# Patient Record
Sex: Female | Born: 1955 | Hispanic: No | Marital: Married
Health system: Midwestern US, Community
[De-identification: ages and names within clinical notes are randomized; demographics above are authoritative.]

## PROBLEM LIST (undated history)

## (undated) DIAGNOSIS — R1013 Epigastric pain: Principal | ICD-10-CM

## (undated) DIAGNOSIS — Z202 Contact with and (suspected) exposure to infections with a predominantly sexual mode of transmission: Principal | ICD-10-CM

## (undated) DIAGNOSIS — N952 Postmenopausal atrophic vaginitis: Principal | ICD-10-CM

## (undated) DIAGNOSIS — R079 Chest pain, unspecified: Secondary | ICD-10-CM

## (undated) DIAGNOSIS — R3129 Other microscopic hematuria: Secondary | ICD-10-CM

## (undated) DIAGNOSIS — I10 Essential (primary) hypertension: Secondary | ICD-10-CM

## (undated) HISTORY — PX: APPENDECTOMY: SHX54

---

## 2015-12-14 ENCOUNTER — Encounter (HOSPITAL_COMMUNITY): Payer: Self-pay | Admitting: Emergency Medicine

## 2015-12-14 ENCOUNTER — Emergency Department (HOSPITAL_COMMUNITY): Payer: Self-pay

## 2015-12-14 ENCOUNTER — Emergency Department (HOSPITAL_COMMUNITY)
Admission: EM | Admit: 2015-12-14 | Discharge: 2015-12-14 | Disposition: A | Discharge status: Home / Self Care | Payer: Self-pay | Attending: Emergency Medicine | Admitting: Emergency Medicine

## 2015-12-14 DIAGNOSIS — I10 Essential (primary) hypertension: Secondary | ICD-10-CM | POA: Insufficient documentation

## 2015-12-14 DIAGNOSIS — E041 Nontoxic single thyroid nodule: Secondary | ICD-10-CM | POA: Insufficient documentation

## 2015-12-14 DIAGNOSIS — R0789 Other chest pain: Secondary | ICD-10-CM | POA: Insufficient documentation

## 2015-12-14 DIAGNOSIS — M542 Cervicalgia: Secondary | ICD-10-CM | POA: Insufficient documentation

## 2015-12-14 HISTORY — DX: Essential (primary) hypertension: I10

## 2015-12-14 LAB — I-STAT TROPONIN, ED
TROPONIN I, POC: 0 ng/mL (ref 0.00–0.08)
Troponin i, poc: 0 ng/mL (ref 0.00–0.08)

## 2015-12-14 LAB — BASIC METABOLIC PANEL
Anion gap: 10 (ref 5–15)
BUN: 14 mg/dL (ref 6–20)
CALCIUM: 9.3 mg/dL (ref 8.9–10.3)
CHLORIDE: 106 mmol/L (ref 101–111)
CO2: 25 mmol/L (ref 22–32)
CREATININE: 0.58 mg/dL (ref 0.44–1.00)
GFR calc non Af Amer: >60 mL/min (ref >60)
Glucose, Bld: 99 mg/dL (ref 65–99)
Potassium: 3.7 mmol/L (ref 3.5–5.1)
SODIUM: 141 mmol/L (ref 135–145)

## 2015-12-14 LAB — CBC WITH DIFFERENTIAL/PLATELET
BASOS PCT: 0 %
Basophils Absolute: 0 10*3/uL (ref 0.0–0.1)
EOS ABS: 0.1 10*3/uL (ref 0.0–0.7)
Eosinophils Relative: 1 %
HCT: 39 % (ref 36.0–46.0)
HEMOGLOBIN: 13.1 g/dL (ref 12.0–15.0)
LYMPHS ABS: 3.8 10*3/uL (ref 0.7–4.0)
Lymphocytes Relative: 39 %
MCH: 30 pg (ref 26.0–34.0)
MCHC: 33.6 g/dL (ref 30.0–36.0)
MCV: 89.4 fL (ref 78.0–100.0)
MONO ABS: 0.7 10*3/uL (ref 0.1–1.0)
MONOS PCT: 8 %
Neutro Abs: 5.1 10*3/uL (ref 1.7–7.7)
Neutrophils Relative %: 52 %
Platelets: 234 10*3/uL (ref 150–400)
RBC: 4.36 MIL/uL (ref 3.87–5.11)
RDW: 12.1 % (ref 11.5–15.5)
WBC: 9.9 10*3/uL (ref 4.0–10.5)

## 2015-12-14 LAB — TSH: TSH: 1.275 u[IU]/mL (ref 0.350–4.500)

## 2015-12-14 MED ORDER — IOPAMIDOL (ISOVUE-300) INJECTION 61%
INTRAVENOUS | Status: AC
  Administered 2015-12-14: 75 mL
  Filled 2015-12-14: qty 75

## 2015-12-14 MED ORDER — KETOROLAC TROMETHAMINE 30 MG/ML IJ SOLN
30.0000 mg | Freq: Once | INTRAMUSCULAR | Status: AC
  Administered 2015-12-14: 30 mg via INTRAVENOUS
  Filled 2015-12-14: qty 1

## 2015-12-14 NOTE — ED Notes (Signed)
Pt. reports right neck pain for 2 weeks with generalized weakness/fatigue , denies fever or chills , respirations unlabored..Marland Kitchen

## 2015-12-14 NOTE — ED Provider Notes (Signed)
Pt visit shared. Patient here for neck pain and chronic chest pain. She is in no distress in the department. CT scan demonstrates dental caries, no evidence of acute abscess requiring antibiotics. She also has incidental thyroid nodule. Discussed with patient and family findings of nodule and dental caries, need for outpatient follow-up. Presentation is not consistent with ACS, PE, dissection.  Paula FossaElizabeth Nikoleta Dady, MD 12/14/15 [phone]

## 2015-12-14 NOTE — Discharge Instructions (Signed)
You were seen in the emergency department for neck pain. You also complaining of chest pain. Labs today have been normal. Her CT scan of her neck showed a thyroid nodule that will need to be followed up by her family doctor with more imaging.  She also has dental carries on her scan and will need to see a dentist.  We recommend close outpatient follow-up with a primary care physician. Your EKG was abnormal but we do not have an old for comparison. We recommend she follow-up with a primary care physician or cardiologist for this.  You may take ibuprofen 800 mg every 8 hours as needed for pain. You may alternate this with Tylenol 1000 mg every 6 hours as needed for pain. These medications are found over-the-counter.   Chest Wall Pain Chest wall pain is pain in or around the bones and muscles of your chest. Sometimes, an injury causes this pain. Sometimes, the cause may not be known. This pain may take several weeks or longer to get better. HOME CARE INSTRUCTIONS  Pay attention to any changes in your symptoms. Take these actions to help with your pain:   Rest as told by your health care provider.   Avoid activities that cause pain. These include any activities that use your chest muscles or your abdominal and side muscles to lift heavy items.   If directed, apply ice to the painful area:  Put ice in a plastic bag.  Place a towel between your skin and the bag.  Leave the ice on for 20 minutes, 2-3 times per day.  Take over-the-counter and prescription medicines only as told by your health care provider.  Do not use tobacco products, including cigarettes, chewing tobacco, and e-cigarettes. If you need help quitting, ask your health care provider.  Keep all follow-up visits as told by your health care provider. This is important. SEEK MEDICAL CARE IF:  You have a fever.  Your chest pain becomes worse.  You have new symptoms. SEEK IMMEDIATE MEDICAL CARE IF:  You have nausea or  vomiting.  You feel sweaty or light-headed.  You have a cough with phlegm (sputum) or you cough up blood.  You develop shortness of breath.   This information is not intended to replace advice given to you by your health care provider. Make sure you discuss any questions you have with your health care provider.   Document Released: 08/16/2005 Document Revised: 05/07/2015 Document Reviewed: 11/11/2014 Elsevier Interactive Patient Education [employer]   To find a primary care or specialty doctor please call [phone] or [phone] to access "[place] Find a Doctor Service."  You may also go on the [facility] website at [email or web address]  There are also multiple [facility] and [facility] practices throughout the [place] that are frequently accepting new patients. You may find a clinic that is close to your home and contact them.  [facility] -  [address] [place] [address] [phone]  [facility] in [place] (also locations in [place] and [place]) -  [address] [place] [address] [phone]  [facility] Department -  [address] [place] [address] [phone]   Thyroid Nodule A thyroid nodule is an isolatedgrowth of thyroid cells that forms a lump in your thyroid gland. The thyroid gland is a butterfly-shaped gland. It is found in the lower front of your neck. This gland sends chemical messengers (hormones) through your blood to all  parts of your body. These hormones are important in regulating your body temperature and helping your body to use energy. Thyroid nodules are common. Most are not cancerous (are benign). You may have one nodule or several nodules.  Different types of thyroid nodules include:  Nodules that grow and fill with fluid (thyroid cysts).  Nodules that produce too much thyroid hormone (hot nodules or  hyperthyroid).  Nodules that produce no thyroid hormone (cold nodules or hypothyroid).  Nodules that form from cancer cells (thyroid cancers). CAUSES Usually, the cause of this condition is not known. RISK FACTORS Factors that make this condition more likely to develop include:  Increasing age. Thyroid nodules become more common in people who are older than 58 years of age.  Gender.  Benign thyroid nodules are more common in women.  Cancerous (malignant) thyroid nodules are more common in men.  A family history that includes:  Thyroid nodules.  Pheochromocytoma.  Thyroid carcinoma.  Hyperparathyroidism.  Certain kinds of thyroid diseases, such as Hashimoto thyroiditis.  Lack of iodine.  A history of head and neck radiation, such as from X-rays. SYMPTOMS It is common for this condition to cause no symptoms. If you have symptoms, they may include:  A lump in your lower neck.  Feeling a lump or tickle in your throat.  Pain in your neck, jaw, or ear.  Having trouble swallowing. Hot nodules may cause symptoms that include:  Weight loss.  Warm, flushed skin.  Feeling hot.  Feeling nervous.  A racing heartbeat. Cold nodules may cause symptoms that include:  Weight gain.  Dry skin.  Brittle hair. This may also occur with hair loss.  Feeling cold.  Fatigue. Thyroid cancer nodules may cause symptoms that include:  Hard nodules that feel stuck to the thyroid gland.  Hoarseness.  Lumps in the glands near your thyroid (lymph nodes). DIAGNOSIS A thyroid nodule may be felt by your health care provider during a physical exam. This condition may also be diagnosed based on your symptoms. You may also have tests, including:  An ultrasound. This may be done to confirm the diagnosis.  A biopsy. This involves taking a sample from the nodule and looking at it under a microscope to see if the nodule is benign.  Blood tests to make sure that your thyroid is  working properly.  Imaging tests such as MRI or CT scan may be done if:  Your nodule is large.  Your nodule is blocking your airway.  Cancer is suspected. TREATMENT Treatment depends on the cause and size of your nodule or nodules. If the nodule is benign, treatment may not be necessary. Your health care provider may monitor the nodule to see if it goes away without treatment. If the nodule continues to grow, is cancerous, or does not go away:  It may need to be drained with a needle.  It may need to be removed with surgery. If you have surgery, part or all of your thyroid gland may need to be removed as well. HOME CARE INSTRUCTIONS  Pay attention to any changes in your nodule.  Take over-the-counter and prescription medicines only as told by your health care provider.  Keep all follow-up visits as told by your health care provider. This is important. SEEK MEDICAL CARE IF:  Your voice changes.  You have trouble swallowing.  You have pain in your neck, ear, or jaw that is getting worse.  Your nodule gets bigger.  Your nodule starts to make it harder for  you to breathe. SEEK IMMEDIATE MEDICAL CARE IF:  You have a sudden fever.  You feel very weak.  Your muscles look like they are shrinking (muscle wasting).  You have mood swings.  You feel very restless.  You feel confused.  You are seeing or hearing things that other people do not see or hear (having hallucinations).  You feel suddenly nauseous or throw up.  You suddenly have diarrhea.  You have chest pain.  There is a loss of consciousness.   This information is not intended to replace advice given to you by your health care provider. Make sure you discuss any questions you have with your health care provider.   Document Released: 07/09/2004 Document Revised: 05/07/2015 Document Reviewed: 11/27/2014 Elsevier Interactive Patient Education [employer]

## 2015-12-14 NOTE — ED Provider Notes (Addendum)
TIME SEEN: 5:00 AM  CHIEF COMPLAINT: Neck pain, chest pain  HPI: Pt is a 60 y.o. female with history of hypertension who recently moved here from [place] 2 weeks ago who presents to the emergency department with complaints of right anterior neck pain for the past 2 weeks. No history of injury to the neck. No swelling to the neck. No fevers currently, cough. She has had left-sided throbbing chest pain for several years that is unchanged. No shortness of breath. No vomiting or diarrhea. No numbness, tingling or focal weakness. No bowel or bladder incontinence. Her son at bedside states that she was told she had something along with her thyroid and they recommended outpatient ultrasound. States he brought her to the hospital today because she could not sleep because of her pain. Patient reports it is painful to move her neck but also painful to swallow. She has no swelling currently but they state that she has had intermittent swelling to this area. They're unsure if she's been told that she has a mass or an infection. They do state that she had a fever 2 weeks ago.  History is limited as patient speaks Arabic. Interpreter used the patient and son are both very poor historians.  ROS: See HPI Constitutional: no fever  Eyes: no drainage  ENT: no runny nose   Cardiovascular:  chest pain  Resp: no SOB  GI: no vomiting GU: no dysuria Integumentary: no rash  Allergy: no hives  Musculoskeletal: no leg swelling  Neurological: no slurred speech ROS otherwise negative  PAST MEDICAL HISTORY/PAST SURGICAL HISTORY:  Past Medical History  Diagnosis Date  . Hypertension     MEDICATIONS:  Prior to Admission medications   Not on File    ALLERGIES:  No Known Allergies  SOCIAL HISTORY:  Social History  Substance Use Topics  . Smoking status: Never Smoker   . Smokeless tobacco: Not on file  . Alcohol Use: No    FAMILY HISTORY: No family history on file.  EXAM: BP 154/87 mmHg  Pulse 61   Temp(Src) 98.5 F (36.9 C) (Oral)  Resp 18  Wt 135 lb 7 oz (61.434 kg)  SpO2 100% CONSTITUTIONAL: Alert and oriented and responds appropriately to questions. Well-appearing; well-nourished, No distress HEAD: Normocephalic EYES: Conjunctivae clear, PERRL ENT: normal nose; no rhinorrhea; moist mucous membranes; No pharyngeal erythema or petechiae, no tonsillar hypertrophy or exudate, no uvular deviation, no trismus or drooling, normal phonation, no stridor, no dental caries or abscess noted, no Ludwig's angina, tongue sits flat in the bottom of the mouth NECK: Supple, no meningismus, no LAD; tender to palpation over the right anterior neck underneath jaw, no thyromegaly appreciated, no midline spinal tenderness or step-off or deformity CARD: RRR; S1 and S2 appreciated; no murmurs, no clicks, no rubs, no gallops CHEST:  Tender to palpation over the left chest wall without crepitus, ecchymosis or deformity. Palpation to this area reproduces her chest pain. RESP: Normal chest excursion without splinting or tachypnea; breath sounds clear and equal bilaterally; no wheezes, no rhonchi, no rales, no hypoxia or respiratory distress, speaking full sentences ABD/GI: Normal bowel sounds; non-distended; soft, non-tender, no rebound, no guarding, no peritoneal signs BACK:  The back appears normal and is non-tender to palpation, there is no CVA tenderness EXT: Normal ROM in all joints; non-tender to palpation; no edema; normal capillary refill; no cyanosis, no calf tenderness or swelling    SKIN: Normal color for age and race; warm; no rash NEURO: Moves all extremities equally, sensation  to light touch intact diffusely, cranial nerves II through XII intact, strength 5/5 in all 4 days, 2+ deep tendon reflexes in bilateral upper and lower extremities, normal gait PSYCH: The patient's mood and manner are appropriate. Grooming and personal hygiene are appropriate.  MEDICAL DECISION MAKING: Patient here with  complaints of right neck pain. Appears to be anterior and I don't appreciate any swelling she does report pain with palpation, movement and swallowing. There is questionable history of fever several weeks ago and possible thyroid mass. Will obtain a CT of her neck for further evaluation to evaluate for possible infection versus cancerous lesions. She has no sign of Ludwig's angina and swallowing without difficulty. Normal phonation.  Also complaining of chest pain on review of systems. Reports this is been present for several years. No shortness of breath currently, nausea, diaphoresis or dizziness. She has hypertension. No other risk factors for CAD. No history of stress test or cardiac catheterization. EKG shows intraventricular conduction delay with no old for comparison. Her pain is reproducible with palpation of the left side of her chest. First troponin is negative. Given her abnormal EKG, will repeat a second troponin. This seems very atypical and at this time I do not feel she needs admission but can be followed as an outpatient. They're comfortable with this plan.  ED PROGRESS: Patient has no leukocytosis. Labs otherwise unremarkable. Chest x-ray shows no infiltrate. There is mild cardiomegaly but no edema. No pneumothorax.  7:00 AM  Due to patient's abnormal EKG, will obtain a second troponin. Her chest pain seems very atypical in nature and is likely musculoskeletal. It has been present for years and is not associated shortness of breath, diaphoresis, nausea, vomiting or dizziness. It does not appear to be exertional. Suspect that her EKG changes are old but we have no old for comparison. I have recommended outpatient follow-up with a cardiologist will provide PCP information. Second troponin will be repeated at 8:30 AM. She is still awaiting a CT scan of her neck. If this is normal, I recommend close outpatient follow-up. Signed out to Dr. Madilyn Hookees who will follow-up on patient's CT imaging as well as  her second troponin. Family comfortable with this plan. TSH is also pending but I instructed family that this likely will not return today and that their primary care physician can follow up on this test.     I reviewed all nursing notes, vitals, pertinent old records, EKGs, labs, imaging (as available).      EKG Interpretation  Date/Time:  Sunday December 14 2015 05:53:14 EDT Ventricular Rate:  70 PR Interval:  180 QRS Duration: 138 QT Interval:  417 QTC Calculation: 450 R Axis:   61 Text Interpretation:  Sinus rhythm IVCD, consider atypical LBBB Baseline wander in lead(s) V3 V4 V5 No old tracing to compare Confirmed by WARD,  DO, KRISTEN [phone]([id number]) on 12/14/2015 5:56:47 AM        Layla MawKristen N Ward, DO 12/14/15 0701  Layla MawKristen N Ward, DO 12/14/15 [id number]

## 2015-12-14 NOTE — ED Notes (Signed)
Patient speaks Arabic.

## 2015-12-26 DIAGNOSIS — I10 Essential (primary) hypertension: Secondary | ICD-10-CM | POA: Insufficient documentation

## 2015-12-26 DIAGNOSIS — Z791 Long term (current) use of non-steroidal anti-inflammatories (NSAID): Secondary | ICD-10-CM | POA: Insufficient documentation

## 2015-12-26 DIAGNOSIS — Z79899 Other long term (current) drug therapy: Secondary | ICD-10-CM | POA: Insufficient documentation

## 2015-12-26 DIAGNOSIS — Z7982 Long term (current) use of aspirin: Secondary | ICD-10-CM | POA: Insufficient documentation

## 2015-12-26 DIAGNOSIS — M545 Low back pain: Secondary | ICD-10-CM | POA: Insufficient documentation

## 2015-12-27 ENCOUNTER — Encounter (HOSPITAL_COMMUNITY): Payer: Self-pay | Admitting: Emergency Medicine

## 2015-12-27 ENCOUNTER — Emergency Department (HOSPITAL_COMMUNITY)
Admission: EM | Admit: 2015-12-27 | Discharge: 2015-12-27 | Disposition: A | Discharge status: Home / Self Care | Payer: Self-pay | Attending: Emergency Medicine | Admitting: Emergency Medicine

## 2015-12-27 ENCOUNTER — Emergency Department (HOSPITAL_COMMUNITY): Payer: Self-pay

## 2015-12-27 DIAGNOSIS — M545 Low back pain, unspecified: Secondary | ICD-10-CM

## 2015-12-27 LAB — URINALYSIS, ROUTINE W REFLEX MICROSCOPIC
Bilirubin Urine: NEGATIVE
Glucose, UA: NEGATIVE mg/dL
Ketones, ur: NEGATIVE mg/dL
Nitrite: NEGATIVE
Protein, ur: NEGATIVE mg/dL
Specific Gravity, Urine: 1.013 (ref 1.005–1.030)
pH: 5.5 (ref 5.0–8.0)

## 2015-12-27 LAB — URINE MICROSCOPIC-ADD ON

## 2015-12-27 MED ORDER — OXYCODONE-ACETAMINOPHEN 5-325 MG PO TABS: 1.0000 | ORAL_TABLET | ORAL | Status: DC | PRN

## 2015-12-27 MED ORDER — OXYCODONE-ACETAMINOPHEN 5-325 MG PO TABS
1.0000 | ORAL_TABLET | Freq: Once | ORAL | Status: AC
  Administered 2015-12-27: 1 via ORAL
  Filled 2015-12-27: qty 1

## 2015-12-27 NOTE — ED Notes (Signed)
Pt speaks Arabic, triage interview conducted using interpretor line. Pt c/o low back pain radiating down bilat legs with reports of numbness to bilat lower extremities.

## 2015-12-27 NOTE — Discharge Instructions (Signed)

## 2015-12-27 NOTE — ED Provider Notes (Signed)
CSN: [id number]     Arrival date & time 12/26/15  2350 History  By signing my name below, I, Linus Galas, attest that this documentation has been prepared under the direction and in the presence of Zadie Rhine, MD. Electronically Signed: Linus Galas, ED Scribe. 12/27/2015. 4:06 AM.   Chief Complaint  Patient presents with  . Back Pain   The history is provided by a relative. No language interpreter was used.   HPI Comments: Paula Espinoza is a 60 y.o. female brought by son to the Emergency Department with a PMHx of HTN complaining of ongoing lower back pain that worsened today. Son states her back pain radiates to her legs. Pt also reports mild bilateral leg weakness. Pt also reports mild bilateral leg weakness.  Pt denies any fever, CP, vomiting, abodominal pain, dysuria, bowel or bladder incontinence, or any other symptoms at this time.   Pt is visiting from [place].  Unable to contact interpreter via phone on multiple attempts. Son provided all the history.   Past Medical History  Diagnosis Date  . Hypertension    Past Surgical History  Procedure Laterality Date  . Appendectomy     No family history on file. Social History  Substance Use Topics  . Smoking status: Never Smoker   . Smokeless tobacco: None  . Alcohol Use: No   OB History    No data available     Review of Systems  Constitutional: Negative for fever.  Cardiovascular: Negative for chest pain.  Gastrointestinal: Negative for vomiting and abdominal pain.  Genitourinary: Negative for dysuria.  Musculoskeletal: Positive for back pain.  Neurological: Positive for weakness.  All other systems reviewed and are negative.  Allergies  Review of patient's allergies indicates no known allergies.  Home Medications   Prior to Admission medications   Medication Sig Start Date End Date Taking? Authorizing Provider  ASPIRIN PO Take 100 mg by mouth daily. ASPEGIC 100 MG(FOREIGN MANUFACTURER MEDICATION)   Yes  Historical Provider, MD  CARVEDILOL PO Take 6.25 mg by mouth 2 (two) times daily. XEDILOL (FOREIGN MANUFACTURER MEDICATION)   Yes Historical Provider, MD  ibuprofen (ADVIL,MOTRIN) 200 MG tablet Take 200 mg by mouth every 6 (six) hours as needed (for pain.).   Yes Historical Provider, MD   BP 148/83 mmHg  Pulse 58  Temp(Src) 97.9 F (36.6 C) (Oral)  Resp 18  Wt 134 lb 7.7 oz (61 kg)  SpO2 94%   Physical Exam   CONSTITUTIONAL: Well developed/well nourished HEAD: Normocephalic/atraumatic EYES: EOMI/PERRL ENMT: Mucous membranes moist NECK: supple no meningeal signs SPINE/BACK:lumbar tenderness noted CV: S1/S2 noted, no murmurs/rubs/gallops noted LUNGS: Lungs are clear to auscultation bilaterally, no apparent distress ABDOMEN: soft, nontender, no rebound or guarding GU:no cva tenderness NEURO: Awake/alert, equal motor 5/5 strength noted with the following: hip flexion/knee flexion/extension, foot dorsi/plantar flexion, great toe extension intact bilaterally, no clonus bilaterally, plantar reflex appropriate (toes downgoing), no sensory deficit in any dermatome.  Equal patellar/achilles reflex noted (2+) in bilateral lower extremities.  Pt is able to ambulate unassisted. EXTREMITIES: pulses normal, full ROM SKIN: warm, color normal PSYCH: no abnormalities of mood noted, alert and oriented to situation   ED Course  Procedures  DIAGNOSTIC STUDIES: Oxygen Saturation is 94% on room air, normal by my interpretation.    COORDINATION OF CARE: 3:59 AM Will give pain medication. Will order lumbar spine x-ray and urinalysis. Discussed treatment plan with pt at bedside and pt agreed to plan.  No neuro deficits Pt ambulates without  difficulty Xray negative Pt well appearing She is visiting from [place] for several months Outpatient referral given Short course of pain meds ordered Discussed strict return precautions with son Stable for d/c  Labs Review Labs Reviewed  URINALYSIS,  ROUTINE W REFLEX MICROSCOPIC (NOT AT [facility]) - Abnormal; Notable for the following:    APPearance CLOUDY (*)    Hgb urine dipstick MODERATE (*)    Leukocytes, UA TRACE (*)    All other components within normal limits  URINE MICROSCOPIC-ADD ON - Abnormal; Notable for the following:    Squamous Epithelial / LPF 6-30 (*)    Bacteria, UA RARE (*)    All other components within normal limits  URINE CULTURE    Imaging Review Dg Lumbar Spine Complete  12/27/2015  CLINICAL DATA:  Lower back pain for 1 week that worsened today. Pain radiates to legs. Initial encounter. EXAM: LUMBAR SPINE - COMPLETE 4+ VIEW COMPARISON:  None. FINDINGS: Numbering is based on the lowest ribs. Rib visualization is difficult on chest x-ray 12/14/2015 at the level of the lower thoracic spine. The vertebra labeled L5 has articulation between the right transverse process and sacrum. This can be a source of chronic pain, but would not explain history of acute pain. There is no fracture, endplate erosion, or evidence of focal bone lesion. No notable disc narrowing or endplate spurring. IMPRESSION: No acute finding or notable degenerative change. Electronically Signed   By: Marnee SpringJonathon  Watts M.D.   On: 12/27/2015 04:35   I have personally reviewed and evaluated these images and lab results as part of my medical decision-making.    MDM   Final diagnoses:  Acute low back pain    Nursing notes including past medical history and social history reviewed and considered in documentation xrays/imaging reviewed by myself and considered during evaluation Labs/vital reviewed myself and considered during evaluation    I personally performed the services described in this documentation, which was scribed in my presence. The recorded information has been reviewed and is accurate.       Zadie Rhineonald Taneal Sonntag, MD 12/27/15 [phone]

## 2015-12-27 NOTE — ED Notes (Signed)
Patient transported to X-ray 

## 2015-12-28 LAB — URINE CULTURE

## 2015-12-29 ENCOUNTER — Telehealth (HOSPITAL_BASED_OUTPATIENT_CLINIC_OR_DEPARTMENT_OTHER): Payer: Self-pay | Admitting: Emergency Medicine

## 2015-12-29 NOTE — Progress Notes (Signed)
ED Antimicrobial Stewardship Positive Culture Follow Up   Paula CreekRabia Espinoza is an 60 y.o. female who [facility] with a chief complaint of  Chief Complaint  Patient presents with  . Back Pain    Recent Results (from the past 720 hour(s))  Urine culture     Status: Abnormal   Collection Time: 12/27/15  5:06 AM  Result Value Ref Range Status   Specimen Description URINE, CLEAN CATCH  Final   Special Requests NONE  Final   Culture (A)  Final    >=100,000 COLONIES/mL GROUP B STREP(S.AGALACTIAE)ISOLATED TESTING AGAINST S. AGALACTIAE NOT ROUTINELY PERFORMED DUE TO PREDICTABILITY OF AMP/PEN/VAN SUSCEPTIBILITY. [facility]formed [facility]    Report Status 12/28/2015 FINAL  Final    [x]  Patient discharged originally without antimicrobial agent and treatment is now indicated  UA looked to be contaminated. Asymptomatic bacteruria   Plan: No treatment necessary  ED Provider: Alveta HeimlichStevi Barrett PA-C   Armandina StammerBATCHELDER,Allie Gerhold J 12/29/2015, 8:56 AM Infectious Diseases Pharmacist Phone# [phone]

## 2015-12-29 NOTE — Telephone Encounter (Signed)
Post ED Visit - Positive Culture Follow-up  Culture report reviewed by antimicrobial stewardship pharmacist:  [x]  Enzo BiNathan Batchelder, Pharm.D. []  Celedonio MiyamotoJeremy Frens, Pharm.D., BCPS []  Garvin FilaMike Maccia, Pharm.D. []  Georgina PillionElizabeth Martin, Pharm.D., BCPS []  [place]h Pham, [place]Pharm.D., BCPS, AAHIVP []  Estella HuskMichelle Turner, Pharm.D., BCPS, AAHIVP []  Tennis Mustassie Stewart, Pharm.D. []  Rob BeavertownVincent, [address]Pharm[address]  Positive urine culture Treated with none, asymptomatic,  and no further patient follow-up is required at this time.  Berle MullMiller, Chrystina Naff 12/29/2015, 9:29 AM

## 2016-01-14 ENCOUNTER — Encounter: Payer: Self-pay | Admitting: Endocrinology

## 2016-01-14 ENCOUNTER — Ambulatory Visit (INDEPENDENT_AMBULATORY_CARE_PROVIDER_SITE_OTHER): Payer: PRIVATE HEALTH INSURANCE | Admitting: Endocrinology

## 2016-01-14 VITALS — BP 110/68 | HR 70 | Temp 98.5°F | Resp 12 | Ht 60.5 in | Wt 135.0 lb

## 2016-01-14 DIAGNOSIS — E041 Nontoxic single thyroid nodule: Secondary | ICD-10-CM | POA: Diagnosis not present

## 2016-01-14 NOTE — Progress Notes (Signed)
Patient ID: Paula CreekRabia Boule, female   DOB: 05/04/1956, 60 y.o.   MRN: [id number]           Reason for Appointment: Evaluation of thyroid nodule    History of Present Illness:   The patient's thyroid nodule was first discovered incidentally on a CT scan done of her neck in the emergency room when she was having neck pain in 4/17   The CT scan showed a heterogenous 1.7 cm right-sided nodule Also has few subcentimeter nodules also  Her neck pain is in the upper part of the right neck area and is still present although somewhat less. She has not been told to have a thyroid enlargement in the past   Lab Results  Component Value Date   TSH 1.275 12/14/2015      Medication List       This list is accurate as of: 01/14/16  9:43 PM.  Always use your most recent med list.               acetaminophen 325 MG tablet  Commonly known as:  TYLENOL  Take 650 mg by mouth as needed.     ASPIRIN PO  Take 100 mg by mouth daily. ASPEGIC 100 MG(FOREIGN MANUFACTURER MEDICATION)     CARVEDILOL PO  Take 6.25 mg by mouth 2 (two) times daily. XEDILOL (FOREIGN MANUFACTURER MEDICATION)        Allergies: No Known Allergies  Past Medical History  Diagnosis Date  . Hypertension     There is no history of radiation to the neck in childhood  Past Surgical History  Procedure Laterality Date  . Appendectomy      Family History  Problem Relation Age of Onset  . Thyroid disease Neg Hx     Social History:  reports that she has never smoked. She does not have any smokeless tobacco history on file. She reports that she does not drink alcohol or use illicit drugs.   Review of Systems:  No history of recent weight change  She thinks she is eating less recently  No unusual fatigue          Examination:   BP 110/68 mmHg  Pulse 70  Temp(Src) 98.5 F (36.9 C) (Oral)  Resp 12  Ht 5' 0.5" (1.537 m)  Wt 135 lb (61.236 kg)  BMI 25.92 kg/m2  SpO2 98%   General Appearance:  well-looking         Eyes: No abnormal prominence or swelling of the eyes         THYROID: Thyroid nodule is palpable on the right side and mostly on swallowing, about 1.5-2 cm.  Left-sided is not palpable.  There is no lymphadenopathy in the neck  She has mild tenderness over the carotid bulb in the right neck Heart sounds normal Lungs clear Reflexes at biceps are normal.  Extremities: No edema  Assessment/Plan:  Thyroid nodule: She appears to have an incidental 1.7 cm nodule on CT scan on the right side This will need to be evaluated further with ultrasound. Discussed with the patient with interpretation done through her son the implications of thought to a nodules in the thyroid as well as the need to evaluate with needle aspiration biopsy; discussed how this would be done if indicated on her ultrasound report  Her TSH has been normal  Neck pain: Radiology and clear, maybe mild carotidynia.  Can try Advil when necessary  [facility] Center[facility]UMAR,Hellen Shanley 01/14/2016

## 2016-01-21 ENCOUNTER — Ambulatory Visit
Admission: RE | Admit: 2016-01-21 | Discharge: 2016-01-21 | Disposition: A | Discharge status: Home / Self Care | Payer: PRIVATE HEALTH INSURANCE | Source: Ambulatory Visit | Attending: Endocrinology | Admitting: Endocrinology

## 2016-01-21 DIAGNOSIS — E041 Nontoxic single thyroid nodule: Secondary | ICD-10-CM

## 2016-01-21 NOTE — Progress Notes (Signed)
Quick Note:  Please let her son know that she has a 2 cm nodule on the right side that needs needle aspiration biopsy. If okay will order this ______

## 2016-01-23 ENCOUNTER — Telehealth: Payer: Self-pay | Admitting: Endocrinology

## 2016-01-23 NOTE — Telephone Encounter (Signed)
No answer on call back 

## 2016-01-23 NOTE — Telephone Encounter (Signed)
Pt son calling back to schedule

## 2016-01-23 NOTE — Telephone Encounter (Signed)
Patient son call for the results of mom lab work

## 2016-01-27 ENCOUNTER — Other Ambulatory Visit: Payer: Self-pay | Admitting: Endocrinology

## 2016-01-27 DIAGNOSIS — E041 Nontoxic single thyroid nodule: Secondary | ICD-10-CM

## 2016-01-27 NOTE — Telephone Encounter (Signed)
Ordered

## 2016-01-27 NOTE — Telephone Encounter (Signed)
Patients son said to please order the biopsy.

## 2016-01-27 NOTE — Telephone Encounter (Signed)
PT said she is calling about an U/S result

## 2016-01-27 NOTE — Telephone Encounter (Signed)
Noted  

## 2016-02-04 NOTE — Telephone Encounter (Signed)
I contacted the pt's son and [facility]27/2016. Pt's son advised [facility] will be in charge of contacting the pt to schedule the biopsy. Pt's son voiced understanding.

## 2016-02-04 NOTE — Telephone Encounter (Signed)
Patient son calling back no one has contacted him to schedule her appt, patient is having pain in her neck.

## 2016-02-11 ENCOUNTER — Other Ambulatory Visit (HOSPITAL_COMMUNITY)
Admission: RE | Admit: 2016-02-11 | Discharge: 2016-02-11 | Disposition: A | Discharge status: Home / Self Care | Payer: PRIVATE HEALTH INSURANCE | Source: Ambulatory Visit | Attending: Radiology | Admitting: Radiology

## 2016-02-11 ENCOUNTER — Ambulatory Visit
Admission: RE | Admit: 2016-02-11 | Discharge: 2016-02-11 | Disposition: A | Discharge status: Home / Self Care | Payer: PRIVATE HEALTH INSURANCE | Source: Ambulatory Visit | Attending: Endocrinology | Admitting: Endocrinology

## 2016-02-11 DIAGNOSIS — E041 Nontoxic single thyroid nodule: Secondary | ICD-10-CM

## 2016-02-13 NOTE — Progress Notes (Signed)
Quick Note:  Please let her son know that biopsy is benign, follow-up in one year ______

## 2021-09-15 NOTE — Telephone Encounter (Signed)
Appointment request project. Patient is inquiring about an appointment with     Patient sent an email requesting to be seen for Urine infection and pain. Please assist.

## 2021-09-23 DIAGNOSIS — R079 Chest pain, unspecified: Secondary | ICD-10-CM

## 2021-09-23 NOTE — ED Provider Notes (Signed)
[facility]  EMERGENCY DEPARTMENT ENCOUNTER      Pt Name: Isabella Ortiz  MRN: [id number]  Birthdate 04/25/1956  Date of evaluation: 09/23/2021  Provider: Retal Tonkinson, DO    CHIEF COMPLAINT       Chief Complaint   Patient presents with    Chest Pain     Pt complains of intermittent left sided chest pain, left head/neck pain, and left arm tightness/numbness starting around 3pm today. Pt states pain is mostly in chest.         HISTORY OF PRESENT ILLNESS   (Location/Symptom, Timing/Onset, Context/Setting, Quality, Duration, Modifying Factors, Severity)  Note limiting factors.       Patient reports that she has hda chest pain for 8 days but yesterday was the worst. She reports it is intermittent and lasting 15-30 minutes. Radiates to left arm, left jaw and has SOB with it. Then itgoes away. She has HTN and HLD    The history is provided by the patient and a relative. No language interpreter was used.     Nursing Notes were reviewed.    REVIEW OF SYSTEMS    (2-9 systems for level 4, 10 or more for level 5)     Review of Systems   Constitutional:  Negative for activity change and chills.   HENT:  Negative for congestion.    Respiratory:  Positive for shortness of breath. Negative for cough.    Cardiovascular:  Positive for chest pain.     Except as noted above the remainder of the review of systems was reviewed and negative.       PAST MEDICAL HISTORY   No past medical history on file.      SURGICAL HISTORY     No past surgical history on file.      CURRENT MEDICATIONS       Previous Medications    No medications on file       ALLERGIES     Patient has no known allergies.    FAMILY HISTORY     No family history on file.       SOCIAL HISTORY          SCREENINGS         Glasgow Coma Scale  Eye Opening: Spontaneous  Best Verbal Response: Oriented  Best Motor Response: Obeys commands  Glasgow Coma Scale Score: 15                     CIWA Assessment  BP: 135/79  Heart Rate: 70                 PHYSICAL EXAM    (up to 7 for level 4,  8 or more for level 5)     ED Triage Vitals   BP Temp Temp Source Heart Rate Resp SpO2 Height Weight   09/23/21 1918 09/23/21 1919 09/23/21 1919 09/23/21 1918 09/23/21 1918 09/23/21 1918 -- --   (!) 142/76 98.5 ??F (36.9 ??C) Oral 68 16 97 %         Physical Exam  Constitutional:       Appearance: Normal appearance.   HENT:      Head: Normocephalic.      Nose: Nose normal.      Mouth/Throat:      Mouth: Mucous membranes are moist.   Eyes:      Extraocular Movements: Extraocular movements intact.   Cardiovascular:      Rate and Rhythm: Normal rate and  regular rhythm.      Pulses: Normal pulses.   Pulmonary:      Effort: Pulmonary effort is normal.      Breath sounds: Normal breath sounds.   Abdominal:      General: Abdomen is flat.      Palpations: Abdomen is soft.   Musculoskeletal:         General: Normal range of motion.      Cervical back: Normal range of motion.   Skin:     General: Skin is warm.      Capillary Refill: Capillary refill takes less than 2 seconds.   Neurological:      General: No focal deficit present.      Mental Status: She is alert and oriented to person, place, and time.   Psychiatric:         Mood and Affect: Mood normal.       DIAGNOSTIC RESULTS       RADIOLOGY:   Non-plain film images such as CT, Ultrasound and MRI are read by the radiologist. Plain radiographic images are visualized and preliminarily interpreted by the emergency physician with the below findings:        Interpretation per the Radiologist below, if available at the time of this note:    No orders to display         ED BEDSIDE ULTRASOUND:   Performed by ED Physician - none    LABS:  Labs Reviewed   COMPREHENSIVE METABOLIC PANEL - Abnormal; Notable for the following components:       Result Value    Glucose 108 (*)     All other components within normal limits   CBC   TROPONIN   TROPONIN   TROPONIN       All other labs were within normal range or not returned as of this dictation.    EMERGENCY DEPARTMENT COURSE and DIFFERENTIAL  DIAGNOSIS/MDM:   Vitals:    Vitals:    09/23/21 1918 09/23/21 1919 09/24/21 0126 09/24/21 0530   BP: (!) 142/76  126/77 135/79   Pulse: 68  67 70   Resp: 16  16 18    Temp:  98.5 ??F (36.9 ??C) 97.2 ??F (36.2 ??C)    TempSrc:  Oral Oral    SpO2: 97%  97% 98%         Medical Decision Making  Amount and/or Complexity of Data Reviewed  Labs: ordered.  ECG/medicine tests: ordered.    Risk  OTC drugs.           REASSESSMENT     ED Course as of 09/24/21 0637   Thu Sep 24, 2021   0608 D/w Dr Sep 26, 2021- Admit to [place] [facility] hospital[facility]     [RR]   [phone] EKG Interpretation:  Rhythm:  Sinus  Rate:  59  Axis:  left  Conduction:  normal and left bundle branch block (complete)  ST-T Wave Morphology:  non-specific  Ectopy:  none  ECG interpreted by me, in the absence of a cardiologist  [RR]   0628 D/w Dr 0629- She spoke to Dr Suzanna Obey and she will accept there. If the next trop is negative possible cards to see here and stress. Dr Laveda Norman also called back following his review of EKG. If second trop if + call stemi since LBBB and no comparision. The first EKG from last night is lost per staff here and cannot be found   [RR]   0635 Rpt Trop is negative which is  reassuring. She will eb admitted roper DT, chance of cards seeing here for stress depending on how long the transport delay is   [RR]      ED Course User Index  [RR] Sandrea Hammond, DO           CONSULTS:  IP CONSULT TO CARDIOLOGY    PROCEDURES:  Unless otherwise noted below, none     Procedures      FINAL IMPRESSION      1. Chest pain, unspecified type          DISPOSITION/PLAN   DISPOSITION Decision To Transfer 09/24/2021 06:36:03 AM      PATIENT REFERRED TO:  No follow-up provider specified.    DISCHARGE MEDICATIONS:  New Prescriptions    No medications on file     Controlled Substances Monitoring:     No flowsheet data found.    (Please note that portions of this note were completed with a voice recognition program.  Efforts were made to edit the dictations but occasionally words are  mis-transcribed.)    Tiphani Mells, DO (electronically signed)  Attending Emergency Physician            Sandrea Hammond, DO  09/24/21 [phone]

## 2021-09-24 ENCOUNTER — Observation Stay
Admission: EM | Admit: 2021-09-24 | Discharge: 2021-09-25 | Disposition: A | Discharge status: Home / Self Care | Payer: PRIVATE HEALTH INSURANCE | Source: Other Acute Inpatient Hospital | Admitting: Nephrology

## 2021-09-24 ENCOUNTER — Inpatient Hospital Stay
Admit: 2021-09-24 | Discharge: 2021-09-24 | Disposition: A | Discharge status: Home / Self Care | Payer: PRIVATE HEALTH INSURANCE | Attending: Emergency Medicine

## 2021-09-24 ENCOUNTER — Inpatient Hospital Stay: Payer: PRIVATE HEALTH INSURANCE

## 2021-09-24 DIAGNOSIS — R079 Chest pain, unspecified: Secondary | ICD-10-CM

## 2021-09-24 LAB — CBC
Hematocrit: 38.7 % (ref 34.0–47.0)
Hematocrit: 38 % (ref 34.0–47.0)
Hemoglobin: 12.9 g/dL (ref 11.5–15.7)
Hemoglobin: 12.6 g/dL (ref 11.5–15.7)
MCH: 30.6 pg (ref 27.0–34.5)
MCH: 30 pg (ref 27.0–34.5)
MCHC: 33.3 g/dL (ref 32.0–36.0)
MCHC: 33.2 g/dL (ref 32.0–36.0)
MCV: 91.7 fL (ref 81.0–99.0)
MCV: 90.5 fL (ref 81.0–99.0)
MPV: 11.6 fL (ref 7.2–13.2)
MPV: 12.2 fL (ref 7.2–13.2)
NRBC Absolute: 0 10*3/uL (ref 0.000–0.012)
NRBC Absolute: 0 10*3/uL (ref 0.000–0.012)
NRBC Automated: 0 % (ref 0.0–0.2)
NRBC Automated: 0 % (ref 0.0–0.2)
Platelets: 174 10*3/uL (ref 140–440)
Platelets: 172 10*3/uL (ref 140–440)
RBC: 4.22 x10e6/mcL (ref 3.60–5.20)
RBC: 4.2 x10e6/mcL (ref 3.60–5.20)
RDW: 12.1 % (ref 11.0–16.0)
RDW: 12.1 % (ref 11.0–16.0)
WBC: 9.7 10*3/uL (ref 3.8–10.6)
WBC: 6.5 10*3/uL (ref 3.8–10.6)

## 2021-09-24 LAB — COMPREHENSIVE METABOLIC PANEL
ALT: 24 U/L (ref 0–35)
AST: 23 U/L (ref 0–35)
Albumin/Globulin Ratio: 1.37 (ref 1.00–2.70)
Albumin: 4.1 g/dL (ref 3.5–5.2)
Alk Phosphatase: 74 U/L (ref 35–117)
Anion Gap: 8 mmol/L (ref 2–17)
BUN: 13 mg/dL (ref 8–23)
CO2: 29 mmol/L (ref 22–29)
Calcium: 9.2 mg/dL (ref 8.8–10.2)
Chloride: 104 mmol/L (ref 98–107)
Creatinine: 0.7 mg/dL (ref 0.5–1.0)
Est, Glom Filt Rate: 97 mL/min/1.73m?? (ref >=90)
Globulin: 3 g/dL (ref 1.9–4.4)
Glucose: 108 mg/dL — ABNORMAL HIGH (ref 70–99)
OSMOLALITY CALCULATED: 282 mOsm/kg (ref 270–287)
Potassium: 3.9 mmol/L (ref 3.5–5.3)
Sodium: 141 mmol/L (ref 135–145)
Total Bilirubin: 0.2 mg/dL (ref 0.00–1.20)
Total Protein: 7.1 g/dL (ref 6.4–8.3)

## 2021-09-24 LAB — TROPONIN
Troponin T: <0.01 ng/mL (ref 0.000–0.010)
Troponin T: <0.01 ng/mL (ref 0.000–0.010)

## 2021-09-24 LAB — APTT: PTT: 30.1 seconds (ref 23.3–34.5)

## 2021-09-24 LAB — CARDIAC PROCEDURE: Body Surface Area: 1.52 m2

## 2021-09-24 MED ORDER — SUCRALFATE 1 G PO TABS
1 GM | Freq: Three times a day (TID) | ORAL | Status: AC
Start: 2021-09-24 — End: 2021-09-25
  Administered 2021-09-24 – 2021-09-25 (×4): 1 g via ORAL

## 2021-09-24 MED ORDER — ASPIRIN 325 MG PO TABS
325 MG | ORAL | Status: AC
Start: 2021-09-24 — End: 2021-09-24
  Administered 2021-09-24: 11:00:00

## 2021-09-24 MED ORDER — VERAPAMIL HCL 2.5 MG/ML IV SOLN
2.5 MG/ML | INTRAVENOUS | Status: DC | PRN
Start: 2021-09-24 — End: 2021-09-24
  Administered 2021-09-24: 17:00:00 3 via INTRA_ARTERIAL

## 2021-09-24 MED ORDER — VERAPAMIL HCL 2.5 MG/ML IV SOLN
2.5 MG/ML | INTRAVENOUS | Status: AC
Start: 2021-09-24 — End: ?

## 2021-09-24 MED ORDER — MIDAZOLAM HCL 5 MG/5ML IJ SOLN
55 MG/ML | INTRAMUSCULAR | Status: AC | PRN
Start: 2021-09-24 — End: 2021-09-24
  Administered 2021-09-24 (×2): 1 via INTRAVENOUS

## 2021-09-24 MED ORDER — ONDANSETRON 4 MG PO TBDP
4 MG | Freq: Three times a day (TID) | ORAL | Status: AC | PRN
Start: 2021-09-24 — End: 2021-09-25

## 2021-09-24 MED ORDER — NITROGLYCERIN 2 % TD OINT
2 % | Freq: Four times a day (QID) | TRANSDERMAL | Status: DC
Start: 2021-09-24 — End: 2021-09-24

## 2021-09-24 MED ORDER — ALUM & MAG HYDROXIDE-SIMETH 200-200-20 MG/5ML PO SUSP
200-200-205 MG/5ML | Freq: Four times a day (QID) | ORAL | Status: AC | PRN
Start: 2021-09-24 — End: 2021-09-25
  Administered 2021-09-25: 05:00:00 30 mL via ORAL

## 2021-09-24 MED ORDER — NITROGLYCERIN 2 % TD OINT
2 % | TRANSDERMAL | Status: DC | PRN
Start: 2021-09-24 — End: 2021-09-25

## 2021-09-24 MED ORDER — HEPARIN SODIUM (PORCINE) 1000 UNIT/ML IJ SOLN
1000 UNIT/ML | INTRAMUSCULAR | Status: AC
Start: 2021-09-24 — End: ?

## 2021-09-24 MED ORDER — ACETAMINOPHEN 325 MG PO TABS
325 | Freq: Four times a day (QID) | ORAL | Status: DC | PRN
Start: 2021-09-24 — End: 2021-09-25
  Administered 2021-09-25: 15:00:00 650 mg via ORAL

## 2021-09-24 MED ORDER — FENTANYL CITRATE (PF) 100 MCG/2ML IJ SOLN
100 MCG/2ML | INTRAMUSCULAR | Status: AC
Start: 2021-09-24 — End: ?

## 2021-09-24 MED ORDER — HEPARIN SODIUM (PORCINE) 1000 UNIT/ML IJ SOLN
1000 UNIT/ML | INTRAMUSCULAR | Status: DC | PRN
Start: 2021-09-24 — End: 2021-09-24

## 2021-09-24 MED ORDER — LIDOCAINE HCL 1 % IJ SOLN
1 % | INTRAMUSCULAR | Status: DC | PRN
Start: 2021-09-24 — End: 2021-09-24
  Administered 2021-09-24: 17:00:00 5 via INTRADERMAL

## 2021-09-24 MED ORDER — PANTOPRAZOLE SODIUM 40 MG IV SOLR
40 MG | Freq: Two times a day (BID) | INTRAVENOUS | Status: AC
Start: 2021-09-24 — End: 2021-09-25
  Administered 2021-09-24 – 2021-09-25 (×3): 40 mg via INTRAVENOUS

## 2021-09-24 MED ORDER — HEPARIN SODIUM (PORCINE) 1000 UNIT/ML IJ SOLN
1000 UNIT/ML | Freq: Once | INTRAMUSCULAR | Status: AC
Start: 2021-09-24 — End: 2021-09-24
  Administered 2021-09-24: 16:00:00 3120 [IU]/kg via INTRAVENOUS

## 2021-09-24 MED ORDER — ACETAMINOPHEN 650 MG RE SUPP
650 | Freq: Four times a day (QID) | RECTAL | Status: DC | PRN
Start: 2021-09-24 — End: 2021-09-25

## 2021-09-24 MED ORDER — FENTANYL CITRATE (PF) 100 MCG/2ML IJ SOLN
100 MCG/2ML | INTRAMUSCULAR | Status: AC | PRN
Start: 2021-09-24 — End: 2021-09-24
  Administered 2021-09-24 (×2): 25 via INTRAVENOUS

## 2021-09-24 MED ORDER — MIDAZOLAM HCL 5 MG/5ML IJ SOLN
55 MG/ML | INTRAMUSCULAR | Status: AC
Start: 2021-09-24 — End: ?

## 2021-09-24 MED ORDER — ENOXAPARIN SODIUM 40 MG/0.4ML IJ SOSY
40 MG/0.4ML | Freq: Every evening | INTRAMUSCULAR | Status: AC
Start: 2021-09-24 — End: 2021-09-25
  Administered 2021-09-25: 05:00:00 40 mg via SUBCUTANEOUS

## 2021-09-24 MED ORDER — NORMAL SALINE FLUSH 0.9 % IV SOLN
0.9 % | Freq: Two times a day (BID) | INTRAVENOUS | Status: AC
Start: 2021-09-24 — End: 2021-09-25
  Administered 2021-09-25 (×3): 10 mL via INTRAVENOUS

## 2021-09-24 MED ORDER — SODIUM CHLORIDE 0.9 % IV SOLN
0.9 % | INTRAVENOUS | Status: AC | PRN
Start: 2021-09-24 — End: 2021-09-25

## 2021-09-24 MED ORDER — ALUM & MAG HYDROXIDE-SIMETH 200-200-20 MG/5ML PO SUSP
200-200-20 MG/5ML | Freq: Four times a day (QID) | ORAL | Status: DC | PRN
Start: 2021-09-24 — End: 2021-09-24

## 2021-09-24 MED ORDER — IOPAMIDOL 76 % IV SOLN
76 % | INTRAVENOUS | Status: DC | PRN
Start: 2021-09-24 — End: 2021-09-24
  Administered 2021-09-24: 17:00:00 40 via INTRA_ARTERIAL

## 2021-09-24 MED ORDER — FAMOTIDINE (PF) 20 MG/2ML IV SOLN
20 MG/2ML | Freq: Once | INTRAVENOUS | Status: AC
Start: 2021-09-24 — End: 2021-09-24
  Administered 2021-09-24: 12:00:00 20 mg via INTRAVENOUS

## 2021-09-24 MED ORDER — ACETAMINOPHEN 325 MG PO TABS
325 MG | ORAL | Status: AC | PRN
Start: 2021-09-24 — End: ?

## 2021-09-24 MED ORDER — NORMAL SALINE FLUSH 0.9 % IV SOLN
0.9 % | INTRAVENOUS | Status: AC | PRN
Start: 2021-09-24 — End: 2021-09-25

## 2021-09-24 MED ORDER — SENNOSIDES 8.6 MG PO TABS
8.6 MG | Freq: Every day | ORAL | Status: AC | PRN
Start: 2021-09-24 — End: 2021-09-25

## 2021-09-24 MED ORDER — HEPARIN SOD (PORCINE) IN D5W 100 UNIT/ML IV SOLN
100 UNIT/ML | INTRAVENOUS | Status: DC
Start: 2021-09-24 — End: 2021-09-24
  Administered 2021-09-24: 17:00:00 12 [IU]/kg/h via INTRAVENOUS

## 2021-09-24 MED ORDER — SODIUM CHLORIDE 0.9 % IV SOLN
0.9 % | INTRAVENOUS | Status: AC
Start: 2021-09-24 — End: 2021-09-24

## 2021-09-24 MED ORDER — ASPIRIN 81 MG PO CHEW
81 MG | Freq: Once | ORAL | Status: AC
Start: 2021-09-24 — End: 2021-09-24
  Administered 2021-09-24: 11:00:00 324 mg via ORAL

## 2021-09-24 MED ORDER — POLYETHYLENE GLYCOL 3350 17 G PO PACK
17 g | Freq: Every day | ORAL | Status: AC | PRN
Start: 2021-09-24 — End: 2021-09-25

## 2021-09-24 MED ORDER — ONDANSETRON HCL 4 MG/2ML IJ SOLN
4 MG/2ML | Freq: Four times a day (QID) | INTRAMUSCULAR | Status: DC | PRN
Start: 2021-09-24 — End: 2021-09-25

## 2021-09-24 MED ORDER — LIDOCAINE HCL 1 % IJ SOLN
1 % | INTRAMUSCULAR | Status: AC
Start: 2021-09-24 — End: ?

## 2021-09-24 MED FILL — SUCRALFATE 1 G PO TABS: 1 GM | ORAL | Qty: 1

## 2021-09-24 MED FILL — HEPARIN SODIUM (PORCINE) 1000 UNIT/ML IJ SOLN: 1000 UNIT/ML | INTRAMUSCULAR | Qty: 10

## 2021-09-24 MED FILL — FENTANYL CITRATE (PF) 100 MCG/2ML IJ SOLN: 100 MCG/2ML | INTRAMUSCULAR | Qty: 2

## 2021-09-24 MED FILL — MIDAZOLAM HCL 5 MG/5ML IJ SOLN: 5 MG/ML | INTRAMUSCULAR | Qty: 5

## 2021-09-24 MED FILL — FAMOTIDINE (PF) 20 MG/2ML IV SOLN: 20 MG/2ML | INTRAVENOUS | Qty: 2

## 2021-09-24 MED FILL — VERAPAMIL HCL 2.5 MG/ML IV SOLN: 2.5 MG/ML | INTRAVENOUS | Qty: 2

## 2021-09-24 MED FILL — LIDOCAINE HCL 1 % IJ SOLN: 1 % | INTRAMUSCULAR | Qty: 20

## 2021-09-24 MED FILL — HEPARIN SOD (PORCINE) IN D5W 100 UNIT/ML IV SOLN: 100 UNIT/ML | INTRAVENOUS | Qty: 250

## 2021-09-24 MED FILL — ASPIRIN 325 MG PO TABS: 325 MG | ORAL | Qty: 1

## 2021-09-24 MED FILL — PANTOPRAZOLE SODIUM 40 MG IV SOLR: 40 MG | INTRAVENOUS | Qty: 40

## 2021-09-24 NOTE — Progress Notes (Signed)
Cardiology Progress Note      Subjective:  patient arrived to [facility] ED from [facility]. Appears comfortable but states having ongoing CP 5/10.       Physical Examination:  General: Awake, alert, no acute distress  Lungs: Clear to auscultation, non-labored respiration  Heart: Normal rate, regular rhythm, no murmur, rub or gallop    Abdomen: Soft, non-tender, non-distended, normal bowel sounds  Extremities: No cyanosis or clubbing. No edema     Telemetry: SR    Objective:  Vitals:    09/24/21 1040   BP: 135/80   Pulse: 62   Resp: 16   Temp: 97.7 ??F (36.5 ??C)   SpO2: 100%     No intake or output data in the 24 hours ending 09/24/21 1104     Current Inpatient Medications    Current Facility-Administered Medications:     nitroglycerin (NITRO-BID) 2 % ointment 0.5 inch, 0.5 inch, Topical, 4 times per day, Jordan Likes, PA-C    heparin (porcine) injection 3,120 Units, 60 Units/kg, IntraVENous, Once, Jordan Likes, PA-C    heparin (porcine) injection 3,120 Units, 60 Units/kg, IntraVENous, PRN, Jordan Likes, PA-C    heparin (porcine) injection 1,560 Units, 30 Units/kg, IntraVENous, PRN, Jordan Likes, PA-C    heparin 25,000 units in dextrose 5% 250 mL (premix) infusion, 5-30 Units/kg/hr, IntraVENous, Continuous, Jordan Likes, PA-C    Current Outpatient Medications:     valsartan (DIOVAN) 80 MG tablet, Take 100 mg by mouth daily, Disp: , Rfl:     amLODIPine (NORVASC) 5 MG tablet, Take 5 mg by mouth daily, Disp: , Rfl:     aspirin 325 MG tablet, Take 100 mg by mouth daily, Disp: , Rfl:     simvastatin (ZOCOR) 20 MG tablet, Take 20 mg by mouth nightly, Disp: , Rfl:     esomeprazole Magnesium (NEXIUM) 20 MG PACK, Take 20 mg by mouth daily, Disp: , Rfl:        Laboratory Data  Hematologic/Coags Chemistries   Recent Labs     09/23/21  1942   WBC 9.7   HGB 12.9   HCT 38.7   PLT 174     Lab Results   Component Value Date/Time    PROT 7.1 09/23/2021 07:42 PM    ALBUMIN 1.37 09/23/2021 07:42 PM     No components found for:  HGBA1C  No results found for: INR, PROTIME  No results found for: APTT  No results found for: DDIMER   Recent Labs     09/23/21  1942   NA 141   K 3.9   CL 104   CO2 29   BUN 13   CREATININE 0.7   ALBUMIN 1.37   BILITOT 0.20   ALKPHOS 74   AST 23   ALT 24     No results for input(s): GLU in the last 72 hours.  Recent Labs     09/23/21  1942 09/24/21  0551   TROPONINT <0.010 <0.010     No components found for: LIPID PANEL  Invalid input(s): LIPIDPANEL  No results found for: IRON, FERRITIN     Inflammatory/Respiratory Diabetes   No results found for: CRP  No results found for: ESR  ABGs:  No results found for: PHART, PO2ART, HCO3, PCO2ART   Lab Results   Component Value Date/Time    CREATININE 0.7 09/23/2021 07:42 PM                Diagnostic Images:  Xray Result (most recent):  No results found for this or any previous visit from the past 3650 days.      ASSESSMENT AND PLAN   1. Unstable angina: seen in [facility] ED with symptoms concerning for progressive now unsable angina with LBBB. Will start nitro paste and Heparin gtt. Will need cardiac cath. Risks and benefits of procedure were discussed with patient.

## 2021-09-24 NOTE — ED Notes (Signed)
Pt transported to cath lab at 11:50  Pt prepped for procedure  Pt had signed consent form with transport  PT A&O X4 on transport  Pts son with Pt and updated on situation.      Cordelia Pen, RN  09/24/21 [phone]

## 2021-09-24 NOTE — Consults (Signed)
Consultation Note             Date:  September 24, 2021  Patient name: Isabella Ortiz  Date of Birth: July 09, 1956    Reason for Consult:    Requesting Physician:    HISTORY OF PRESENT ILLNESS:   Isabella Ortiz is a 66 y.o. Other female who presents for evaluation for chest pain.  This is a 66 year old female who is from [place].  Unfortunately the history is somewhat complicated by language barrier.  The patient is accompanied by her son who serves as a Nurse, learning disability.    The patient describes episodic chest discomfort.  Apparently this has been going on for quite some time.  She describes the discomfort as a tightness in her chest that radiates up to her arm into her neck as well as into her jaw and even into the backside of her neck and back.  These episodes are sporadic and can last 10 to 15 minutes in duration.  They are associated with shortness of breath but no nausea emesis or diaphoresis.  She states that when she was in [place] she went to an emergency room there where they did blood work and apparently no further evaluation.  She is in town visiting with her family.  And these episodes have been getting worse for the past several months.  She states that they are occurring several times a day.  She complained yesterday to her son who brought her to the emergency room for evaluation.    In the emergency room an EKG revealed evidence of a left bundle branch block.  She had 2 sets of cardiac enzymes both of which were negative for myocardial necrosis.    Patient's cardiovascular risk factors include hypertension as well as hyperlipidemia.  Her family history is unknown.  There is no history of cigarette smoking.      PAST HISTORY    Past Medical History:    Hypertension.  Hyperlipidemia    Past Surgical History:   has no past surgical history on file.     Social History:   The patient is from [place] she is currently visiting her family.  No history of tobacco use.  Not use alcohol.    Family History: family history  is not on file.    REVIEW OF SYSTEMS:    Review of Systems - General ROS: negative for - chills, fever, or malaise  Psychological ROS: negative for - anxiety, behavioral disorder, or depression  ENT ROS: negative for - epistaxis, headaches, or visual changes  Endocrine ROS: negative for - malaise/lethargy, temperature intolerance, or unexpected weight changes  Respiratory ROS: negative for - cough, shortness of breath, or wheezing  Cardiovascular ROS: negative for - chest pain, dyspnea on exertion, edema, irregular heartbeat, loss of consciousness, palpitations, or paroxysmal nocturnal dyspnea  Gastrointestinal ROS: negative for - abdominal pain, appetite loss, blood in stools, change in bowel habits, change in stools, constipation, diarrhea, heartburn, or nausea/vomiting  Genito-Urinary ROS: negative for - dysuria or hematuria  Musculoskeletal ROS: negative for - joint pain, joint swelling, muscle pain, or muscular weakness  Neurological ROS: negative for - gait disturbance, numbness/tingling, visual changes, or weakness       PHYSICAL EXAMINATION      BP 135/79    Pulse 70    Temp 97.2 ??F (36.2 ??C) (Oral)    Resp 18    SpO2 98%    Gen:  Alert and oriented ??3 in no apparent distress  HEENT:  Normocephalic  and atraumatic; clear oropharynx  Cardiovascular: Regular rate and rhythm, normal S1/2, no murmurs, no JVD  Lungs: Clear to auscultation bilaterally  Abdomen: Nontender nondistended  Extremities: No clubbing cyanosis or edema  Neuro:  Ambulates with normal gait  Psych:  Appropriate mood and affect  Derm:  No new rashes or lesions    HOME MEDICATIONS     Prior to Admission medications    Not on File       Allergies:  Patient has no known allergies.    LABS AND DIAGNOSTICS      CBC:  Recent Labs     09/23/21  1942   WBC 9.7   RBC 4.22   HGB 12.9   HCT 38.7   MCV 91.7   RDW 12.1   PLT 174     CHEMISTRIES:  Recent Labs     09/23/21  1942   NA 141   K 3.9   CL 104   CO2 29   BUN 13   CREATININE 0.7   GLUCOSE 108*      LIVER PROFILE:  Recent Labs     09/23/21  1942   AST 23   ALT 24   BILITOT 0.20   ALKPHOS 74     No results for input(s): PROBNP in the last 72 hours.  Recent Labs     09/23/21  1942 09/24/21  0551   TROPONINT <0.010 <0.010         Xray Result (most recent)  No results found for this or any previous visit from the past 3650 days.      CT Result (most recent):  No results found for this or any previous visit from the past 3650 days.      ECHO:   No results found for this or any previous visit.      ECG: Sinus rhythm with a left bundle branch block    ASSESSMENT / RECOMMENDATIONS      Active Problems:  Chest pain: The patient is 66 year old female with risk factors for heart disease include hypertension hyperlipidemia.  She now presents with a longstanding history of chest discomfort but has gotten worse in a crescendo pattern.  She has a left bundle branch block noted on her electrocardiogram.  Given the crescendo nature of her chest discomfort as well as the symptoms it is quite concerning that this may represent progressive angina.  Given the left bundle branch block she certainly could be at risk of three-vessel disease making a [place] nuclear stress test somewhat limited in its utility to evaluate her situation.  Given that it seems best to proceed directly with cardiac catheterization for further evaluation of her coronary arteries.    I discussed this at length with her son who is at the bedside.  I explained to him the procedure in depth including the risks and indications for the procedure.    The patient is can be transferred to [facility] in order to undergo cardiac catheterization later this afternoon.    We very much appreciate the opportunity able to continue to assist in the care of this most interesting patient be happy to follow along with you.        Clarita Crane, MD    NOTE: This report was transcribed using voice recognition software. Every effort was made to ensure accuracy,  however, inadvertent computerized transcription errors may be present.

## 2021-09-24 NOTE — H&P (Signed)
Hospitalist History and Physical      CHIEF COMPLAINT:    Chief Complaint   Patient presents with    Chest Pain     Pt complains of chest pain x 8 days, pt is being held in ed for cath lab and admission.        Reason for Admission:  Chest Pain    History Obtained From:  Patient, Son    HISTORY OF PRESENT ILLNESS:      Patient is a 66 year old woman who is visiting her son from [place].  Patient understands Arabic though speaks a specific dialect that is a combination of [place] and Arabic.  I utilized a Financial tradertranslator via iPad and Arabic however patient was unable to adequately communicate with the translator.  We attempted to find a [place] and [place] [employer] sales representativespeaking translator they were unable to do so.  Patient and son are comfortable with patient's son translating for them.  Patient has had complaints of chest pain which has been present off and on for what sounds like about 2 years.  She has had previous endoscopic evaluation in last year with both colonoscopy and EGD which per patient was negative.  She does have some reflux type symptoms and notes some sour and burning taste in her mouth at times.  That seems to be a distinct problem other than this presenting chest pain.  Chest pain is located retrosternal i and with some radiation to the jaw as well as into the left arm.  Patient does not seem to have a positional component to the pain as well as does not have a inspiratory exacerbation of the pain.  Overall the symptoms have progressed and worsened over the last 8 to 10 days.  Patient has been treated with a urinary tract infection soon after arriving in the [place] from [place] at the end of December.  Patient is currently feeling well.  She is undergone a left heart cath with right radial access.  Discussed with cardiology that there were no significant obstructive lesions.  Hospital service is asked to see patient for admission for chest pain symptoms.  Patient is from [place] she plans to stay with her son  for the next 5 months    Past Medical History:     has no past medical history on file.  GERD    Past Surgical History:    No past surgical history on file.     Medications Prior to Admission:    Current Outpatient Medications   Medication Instructions    amLODIPine (NORVASC) 5 mg, Oral, DAILY    aspirin 100 mg, Oral, DAILY    esomeprazole Magnesium (NEXIUM) 20 mg, Oral, DAILY    simvastatin (ZOCOR) 20 mg, Oral, NIGHTLY    valsartan (DIOVAN) 100 mg, Oral, DAILY       Allergies:    Patient has no known allergies.    Social History:   Social History       Tobacco History       Smoking Status  Never Assessed      Smokeless Tobacco Use  Unknown              Alcohol History       Alcohol Use Status  Not Asked              Drug Use       Drug Use Status  Not Asked              Sexual  Activity       Sexually Active  Not Asked                Patient does not drink alcohol or smoke cigarettes.  She lives in [place] though is here to visit her son  She does not work outside the home.    Family History:   No family history on file.  No significant cardiopulmonary disease in the family    REVIEW OF SYSTEMS:  I have perfromed a 12 point review of systems on this patient and is negative except for positive portions mentioned in HPI above.     PHYSICAL EXAM:  Vitals:  BP (!) 141/79    Pulse 61    Temp 97.7 ??F (36.5 ??C) (Oral)    Resp 16    Ht 5\' 3"  (1.6 m)    Wt 114 lb 10.2 oz (52 kg)    SpO2 99%    BMI 20.31 kg/m??   Body mass index is 20.31 kg/m??.   General patient is a well-developed woman is resting comfortably in bed in no acute distress.  HEENT anicteric MMM  Pulmonary is clear bilaterally   cardiovascular is regular with what sounds to be a soft murmur  Abdomen is soft nontender light palpation.  No rebound or guarding.  Extremities no edema.    LABS:  Lab Results   Component Value Date    NA 141 09/23/2021    K 3.9 09/23/2021    CL 104 09/23/2021    CO2 29 09/23/2021    BUN 13 09/23/2021    CREATININE 0.7 09/23/2021     GLUCOSE 108 (H) 09/23/2021    CALCIUM 9.2 09/23/2021    PROT 7.1 09/23/2021    LABALBU 4.1 09/23/2021    BILITOT 0.20 09/23/2021    ALKPHOS 74 09/23/2021    AST 23 09/23/2021    ALT 24 09/23/2021    LABGLOM 97 09/23/2021    GLOB 3.0 09/23/2021       Lab Results   Component Value Date    WBC 6.5 09/24/2021    HGB 12.6 09/24/2021    HCT 38.0 09/24/2021    MCV 90.5 09/24/2021    PLT 172 09/24/2021       No results found for: CKTOTAL, CKMB, CKMBINDEX, TROPONINI    No results found for: VOL, APPEARANCE, COLORU, LABSPEC, LABPH, LEUKBLD, NITRU, GLUCOSEU, KETUA, UROBILINOGEN, KETUA, UROBILINOGEN, BILIRUBINUR, OCBU    IMAGING:  Cardiac procedure    Result Date: 09/24/2021    Normal coronary arteries   Normal LVEDP       ASSESSMENT AND PLAN:    Principal Problem:    Chest pain, unspecified    66 year old woman with a past medical history of hypertension and dyslipidemia presents with chest pain has been intermittent for the last 2 years.    Chest pain.  Etiology is uncertain.  Patient with concerning features underlying left bundle branch block and was taken to the cardiac Cath Lab where she underwent coronary angiography which was negative for any obstructive lesions.   Admit her to observation with unclear etiology of her chest pain symptoms.  There is what appears to be cardiac murmur versus a rub and will evaluate her pericardium with a 2D echo.  Pulmonary/pleural etiology is on the differential as well as a central vascular etiology.  If no diagnosis forthcoming and no resolution of symptoms will consider a CTA of the chest.  Place patient on empiric PPI as well as some  Carafate.  Advance diet as tolerated.    Continue to monitor symptoms.    Hypertension.  Plan to restart home medications.    Continue to monitor blood pressures during hospitalization    Dyslipidemia   Continue statin therapy.    Left Bundle branch block.  Possibly chronic        Patient to observation telemetry status.  Check labs in the morning.   Possible discharge to home with outpatient follow-up pending symptoms and results of pending tests    Son updated at length at bedside.  All his questions were answered best my ability      DVT prophylaxis: lovenox  Dispo: obs tele  Code status: Full Code   Medical Decision maker:Son YOussef  PCP: Pcp No     This note was created using voice recognition software and may contain typographic errors missed during final review. The intent is to have a complete and accurate medical record.  As a valued partner in this safety effort, if you have noted factual errors, please complete the Health Information Amendment/Correct Form or call the [facility] Health Information Management Office at [phone].    For any questions, please contact Team E

## 2021-09-25 ENCOUNTER — Observation Stay: Admit: 2021-09-25 | Payer: PRIVATE HEALTH INSURANCE

## 2021-09-25 LAB — TRANSTHORACIC ECHOCARDIOGRAM (TTE) COMPLETE (CONTRAST/BUBBLE/3D PRN)
AV Area by Peak Velocity: 1.6 cm2
AV Area by VTI: 1.8 cm2
AV Mean Gradient: 7 mmHg
AV Mean Velocity: 1.2 m/s
AV Peak Gradient: 12 mmHg
AV Peak Velocity: 1.8 m/s
AV VTI: 36.3 cm
AV Velocity Ratio: 0.5
AVA/BSA Peak Velocity: 1.1 cm2/m2
AVA/BSA VTI: 1.2 cm2/m2
Ao Root Index: 1.91 cm/m2
Aortic Root: 2.9 cm
Ascending Aorta Index: 2.11 cm/m2
Ascending Aorta: 3.2 cm
Body Surface Area: 1.52 m2
Est. RA Pressure: 3 mmHg
IVC Expiration: 1.3 cm
IVSd: 1.2 cm — AB (ref 0.6–0.9)
LA Area 4C: 16 cm2
LA Diameter: 2.6 cm
LA Major Axis: 6 cm
LA Size Index: 1.71 cm/m2
LA Volume 4C: 34 mL (ref 22–52)
LA Volume Index 4C: 22 mL/m2 (ref 16–34)
LA/AO Root Ratio: 0.9
LV EDV A4C: 72 mL
LV EDV Index A4C: 47 mL/m2
LV ESV A4C: 32 mL
LV ESV Index A4C: 21 mL/m2
LV Ejection Fraction A4C: 56 %
LV Mass 2D Index: 100.4 g/m2 — AB (ref 43–95)
LV Mass 2D: 152.6 g (ref 67–162)
LV RWT Ratio: 0.42
LVIDd Index: 2.83 cm/m2
LVIDd: 4.3 cm (ref 3.9–5.3)
LVOT Area: 3.1 cm2
LVOT Diameter: 2 cm
LVOT Mean Gradient: 2 mmHg
LVOT Peak Gradient: 3 mmHg
LVOT Peak Velocity: 0.9 m/s
LVOT SV: 65.6 ml
LVOT Stroke Volume Index: 43.2 mL/m2
LVOT VTI: 20.9 cm
LVOT:AV VTI Index: 0.58
LVPWd: 0.9 cm (ref 0.6–0.9)
Left Ventricular Ejection Fraction: 63
MR Peak Gradient: 21 mmHg
MR Peak Velocity: 2.3 m/s
MV A Velocity: 0.84 m/s
MV E Velocity: 0.72 m/s
MV E Wave Deceleration Time: 167 ms
MV E/A: 0.86
RV Free Wall Peak S': 15 cm/s
RVIDd: 4.7 cm
RVSP: 30 mmHg
TAPSE: 2.7 cm (ref 1.7–?)
TR Max Velocity: 2.61 m/s
TR Peak Gradient: 27 mmHg

## 2021-09-25 LAB — COMPREHENSIVE METABOLIC PANEL W/ REFLEX TO MG FOR LOW K
ALT: 20 U/L (ref 0–35)
AST: 22 U/L (ref 0–35)
Albumin/Globulin Ratio: 1.4 (ref 1.00–2.70)
Albumin: 3.8 g/dL (ref 3.5–5.2)
Alk Phosphatase: 66 U/L (ref 35–117)
Anion Gap: 7 mmol/L (ref 2–17)
BUN: 15 mg/dL (ref 8–23)
CALCIUM,CORRECTED,CCA: 9.3 mg/dL (ref 8.8–10.2)
CO2: 29 mmol/L (ref 22–29)
Calcium: 9.1 mg/dL (ref 8.8–10.2)
Chloride: 104 mmol/L (ref 98–107)
Creatinine: 0.6 mg/dL (ref 0.5–1.0)
Est, Glom Filt Rate: 101 mL/min/1.73m?? (ref >=90)
Globulin: 2.8 g/dL (ref 1.9–4.4)
Glucose: 98 mg/dL (ref 70–99)
OSMOLALITY CALCULATED: 280 mOsm/kg (ref 270–287)
Potassium: 3.7 mmol/L (ref 3.5–5.3)
Sodium: 140 mmol/L (ref 135–145)
Total Bilirubin: 0.38 mg/dL (ref 0.00–1.20)
Total Protein: 6.6 g/dL (ref 6.4–8.3)

## 2021-09-25 LAB — CBC WITH AUTO DIFFERENTIAL
Absolute Baso #: 0.1 10*3/uL (ref 0.0–0.2)
Absolute Eos #: 0.1 10*3/uL (ref 0.0–0.5)
Absolute Lymph #: 2.6 10*3/uL (ref 1.0–3.2)
Absolute Mono #: 0.8 10*3/uL (ref 0.3–1.0)
Basophils %: 0.9 % (ref 0.0–2.0)
Eosinophils %: 1 % (ref 0.0–7.0)
Hematocrit: 37.1 % (ref 34.0–47.0)
Hemoglobin: 12.7 g/dL (ref 11.5–15.7)
Immature Grans (Abs): 0.04 10*3/uL (ref 0.00–0.06)
Immature Granulocytes: 0.5 % (ref 0.0–0.6)
Lymphocytes: 33.8 % (ref 15.0–45.0)
MCH: 30.8 pg (ref 27.0–34.5)
MCHC: 34.2 g/dL (ref 32.0–36.0)
MCV: 89.8 fL (ref 81.0–99.0)
MPV: 12.7 fL (ref 7.2–13.2)
Monocytes: 9.8 % (ref 4.0–12.0)
NRBC Absolute: 0 10*3/uL (ref 0.000–0.012)
NRBC Automated: 0 % (ref 0.0–0.2)
Neutrophils %: 54 % (ref 42.0–74.0)
Neutrophils Absolute: 4.1 10*3/uL (ref 1.6–7.3)
Platelets: 156 10*3/uL (ref 140–440)
RBC: 4.13 x10e6/mcL (ref 3.60–5.20)
RDW: 12.1 % (ref 11.0–16.0)
WBC: 7.7 10*3/uL (ref 3.8–10.6)

## 2021-09-25 LAB — APTT
PTT: 41.5 seconds — ABNORMAL HIGH (ref 23.3–34.5)
PTT: 35.4 seconds — ABNORMAL HIGH (ref 23.3–34.5)

## 2021-09-25 MED ORDER — ALUM & MAG HYDROXIDE-SIMETH 200-200-20 MG/5ML PO SUSP
200-200-20 MG/5ML | Freq: Four times a day (QID) | ORAL | Status: DC | PRN
Start: 2021-09-25 — End: 2021-09-25
  Administered 2021-09-25: 15:00:00 30 mL via ORAL

## 2021-09-25 MED ORDER — IOPAMIDOL 76 % IV SOLN
76 % | Freq: Once | INTRAVENOUS | Status: AC | PRN
Start: 2021-09-25 — End: 2021-09-25
  Administered 2021-09-25: 16:00:00 100 mL via INTRAVENOUS

## 2021-09-25 MED ORDER — ALUM & MAG HYDROXIDE-SIMETH 200-200-20 MG/5ML PO SUSP
200-200-205 MG/5ML | Freq: Four times a day (QID) | ORAL | 0 refills | Status: AC | PRN
Start: 2021-09-25 — End: 2021-10-02

## 2021-09-25 MED ORDER — SUCRALFATE 1 G PO TABS
1 GM | ORAL_TABLET | Freq: Three times a day (TID) | ORAL | 0 refills | Status: AC
Start: 2021-09-25 — End: ?

## 2021-09-25 MED ORDER — ONDANSETRON 4 MG PO TBDP
4 MG | ORAL_TABLET | Freq: Three times a day (TID) | ORAL | 0 refills | Status: DC | PRN
Start: 2021-09-25 — End: 2021-10-02

## 2021-09-25 MED ORDER — ESOMEPRAZOLE MAGNESIUM 20 MG PO PACK
20 MG | Freq: Two times a day (BID) | ORAL | 0 refills | Status: AC
Start: 2021-09-25 — End: ?

## 2021-09-25 MED FILL — PANTOPRAZOLE SODIUM 40 MG IV SOLR: 40 MG | INTRAVENOUS | Qty: 40

## 2021-09-25 MED FILL — SUCRALFATE 1 G PO TABS: 1 GM | ORAL | Qty: 1

## 2021-09-25 MED FILL — TYLENOL 325 MG PO TABS: 325 MG | ORAL | Qty: 2

## 2021-09-25 MED FILL — BD POSIFLUSH 0.9 % IV SOLN: 0.9 % | INTRAVENOUS | Qty: 10

## 2021-09-25 MED FILL — ENOXAPARIN SODIUM 40 MG/0.4ML IJ SOSY: 40 MG/0.4ML | INTRAMUSCULAR | Qty: 0.4

## 2021-09-25 MED FILL — MAG-AL PLUS 200-200-20 MG/5ML PO LIQD: 200-200-20 MG/5ML | ORAL | Qty: 30

## 2021-09-25 NOTE — Discharge Summary (Signed)
Discharge Summary     Isabella Ortiz Identification:  Isabella Ortiz  DOB: [birth year]  MRN: [id number]   Account: [id number]     Admit date: 09/24/2021  Discharge date: 09/25/21  Attending provider: Janit Bern, DO        Primary care provider: Pcp No     Discharge Diagnoses:   Principal Problem:    Chest pain, unspecified  Active Problems:    Chest pain  Resolved Problems:    * No resolved hospital problems. [facility] Hospital Course:   Isabella Ortiz is a 66 y.o. female admitted to [facility]  09/24/2021 for chest pain  Taken from the HPI    HISTORY OF PRESENT ILLNESS:       Isabella Ortiz is a 66 year old woman who is visiting her son from [place].  Isabella Ortiz understands Arabic though speaks a specific dialect that is a combination of [place] and Arabic.  I utilized a Scientist, physiological and Arabic however Isabella Ortiz was unable to adequately communicate with the translator.  We attempted to find a [place] and [place] [employer] planner they were unable to do so.  Isabella Ortiz and son are comfortable with Isabella Ortiz's son translating for them.  Isabella Ortiz has had complaints of chest pain which has been present off and on for what sounds like about 2 years.  She has had previous endoscopic evaluation in last year with both colonoscopy and EGD which per Isabella Ortiz was negative.  She does have some reflux type symptoms and notes some sour and burning taste in her mouth at times.  That seems to be a distinct problem other than this presenting chest pain.  Chest pain is located retrosternal i and with some radiation to the jaw as well as into the left arm.  Isabella Ortiz does not seem to have a positional component to the pain as well as does not have a inspiratory exacerbation of the pain.  Overall the symptoms have progressed and worsened over the last 8 to 10 days.  Isabella Ortiz has been treated with a urinary tract infection soon after arriving in the [place] from [place] at the end of December.  Isabella Ortiz is currently feeling well.  She is undergone a  left heart cath with right radial access.  Discussed with cardiology that there were no significant obstructive lesions.  Hospital service is asked to see Isabella Ortiz for admission for chest pain symptoms.  Isabella Ortiz is from [place] she plans to stay with her son for the next 5 months     Hospital course.    Isabella Ortiz was transferred from [facility] and underwent left heart catheterization on 09/24/2021 by Dr. Anthony Sar which demonstrated no significant obstructive coronary disease and normal LVEDP.  Isabella Ortiz underwent right wrist access.  Overall did well with catheterization.  She continued to complain of some intermittent pain which was sometimes on her left side than on the right side.  She did have some occasional radiation to her left arm and into her back into her neck as well as into her jaw.  She had some epigastric pain and some generalized abdominal pains.  Efforts were made with translation in Arabic as well as trying to find a speaker in a [place] or [place].  Isabella Ortiz and her son were comfortable with him translating for her during the hospital encounter.  Isabella Ortiz was started on a GI cocktail which seemed to provide some intermittent relief.  She continued to have some chest symptoms.  She underwent an echocardiogram which  did not demonstrate any significant pericardial disease.  Given the persistence of her symptoms she underwent a CTA of the chest which did not demonstrate any pulmonary embolus or suggestive of dissection of the great vessels.  We will plan to continue her on a PPI as well as Carafate trial.  If symptoms would persist she may need to go under additional diagnostic evaluation including possibly an esophagram and EGD.  Overall Isabella Ortiz felt improved with her pain symptoms and extensive time counseling was spent with Isabella Ortiz and son reviewing hospital course.  Questions were answered to the best of my ability    Discharge Medications:     Medication List        START taking these  medications      aluminum & magnesium hydroxide-simethicone 200-200-20 MG/5ML Susp suspension  Commonly known as: MAALOX  Take 30 mLs by mouth every 6 hours as needed for Indigestion     ondansetron 4 MG disintegrating tablet  Commonly known as: ZOFRAN-ODT  Take 1 tablet by mouth every 8 hours as needed for Nausea or Vomiting     sucralfate 1 GM tablet  Commonly known as: CARAFATE  Take 1 tablet by mouth every 8 hours            CHANGE how you take these medications      esomeprazole Magnesium 20 MG Pack  Commonly known as: NEXIUM  Take 1 packet by mouth 2 times daily  What changed: when to take this            CONTINUE taking these medications      amLODIPine 5 MG tablet  Commonly known as: NORVASC     aspirin 325 MG tablet     simvastatin 20 MG tablet  Commonly known as: ZOCOR     valsartan 80 MG tablet  Commonly known as: DIOVAN               Where to Get Your Medications        These medications were sent to [place], [facility] [address] [phone]  [place] [address]      Phone: [phone]   aluminum & magnesium hydroxide-simethicone 200-200-20 MG/5ML Susp suspension  esomeprazole Magnesium 20 MG Pack  ondansetron 4 MG disintegrating tablet  sucralfate 1 GM tablet         Isabella Ortiz Instructions:    Discharge lab work: Per primary care recommend establishing  Activity: As tolerated  Diet: ADULT DIET; Regular    Code Status: Full Code    Follow-up visits:   [place].  [address]  [place] [address]  [phone]  Follow up  establish care with a primary care physician while here in the [place].  - call for appointment.    Fonnie Jarvis, MD  [address] [place] [address]  [phone]    Follow up  follow up as needed if your symptoms do not improve - may need upper endoscopy eval.       Procedures: Left heart cath 09/24/2021    Consults:    Cardiology    Examination:  Vitals:  Vitals:    09/25/21 0521 09/25/21 0800 09/25/21 1104 09/25/21 1432   BP: 137/78  137/78 (!) 145/76   Pulse: 60 58  68   Resp: '16 16  16   '$ Temp: 97.5 ??F (36.4 ??C) 97.3 ??  F (36.3 ??C)  97.5 ??F (36.4 ??C)   TempSrc: Oral Oral  Oral   SpO2: 97%   98%   Weight:   114 lb (51.7 kg)    Height:   '5\' 3"'$  (1.6 m)      Weight: Weight: 114 lb (51.7 kg)     24 hour intake/output:No intake or output data in the 24 hours ending 09/25/21 1603    General appearance well-appearing no acute distress  Pulmonary clear   Cards regular   abdomen is soft  Extremities without edema      Significant Diagnostics:   Radiology: CTA CHEST W WO CONTRAST    Result Date: 09/25/2021  CTA chest, CT abdomen pelvis: 09/25/21 INDICATION: Chest pain. R07.2,Precordial pain,ICD-10-CM epigastric pain COMPARISON: None TECHNIQUE: PE protocol (Postcontrast imaging from the thoracic inlet through the  hemidiaphragms in the pulmonary arterial phase. Axial 5x5 mm soft tissue and lung 2x2 mm images. Multiplanar 3-D volumetric MIP reconstructions through the pulmonary arteries per protocol.) CT scanning was performed using radiation dose  reduction techniques when appropriate, per system protocols. Routine protocol (Axial portal venous phase imaging from the lung bases to the pubic symphysis following IV contrast with coronal reconstruction.) CT scanning was performed using radiation dose reduction techniques when appropriate, per system protocols. CT scanning was performed using radiation dose reduction techniques when appropriate, per system protocols. FINDINGS: Vascular: No pulmonary embolism. Motion artifact in the lung bases limits evaluation. Lower Neck: Unremarkable. Airway: Patent. Lungs: No mass or consolidation. 2 mm nodule in the medial right lower lobe scarring versus atelectasis in the lingula and lower lobes. Pleura: No pneumothorax. No effusion. Heart: No pericardial effusion. Mediastinum/Nodes: No adenopathy. Bones:  No concerning osseous abnormality. Body Wall: Unremarkable. Motion artifact limits evaluation of the upper abdomen. Hepatobiliary: No hepatic mass. No biliary dilatation. Gallbladder: Unremarkable Pancreas: No mass or ductal dilation. Spleen: Unremarkable. GI: Mild rectosigmoid wall thickening. No obstruction. Peritoneum/Retroperitoneum: Unremarkable Adrenal: Unremarkable. Renal/Urinary: No mass or hydronephrosis. Left renal cysts. Reproductive: Unremarkable. Lymph Nodes: No adenopathy. Bones: No concerning osseous lesions. Body Wall: Unremarkable. Vascular: Portal vein is patent. May Thurner configuration.     No pulmonary embolus. Mild rectosigmoid colonic wall thickening, could reflect proctocolitis the appropriate clinical context. Recommend follow-up colonoscopy if not recently performed.    CT ABDOMEN PELVIS W IV CONTRAST Additional Contrast? None    Result Date: 09/25/2021  CTA chest, CT abdomen pelvis: 09/25/21 INDICATION: Chest pain. R07.2,Precordial pain,ICD-10-CM epigastric pain COMPARISON: None TECHNIQUE: PE protocol (Postcontrast imaging from the thoracic inlet through the  hemidiaphragms in the pulmonary arterial phase. Axial 5x5 mm soft tissue and lung 2x2 mm images. Multiplanar 3-D volumetric MIP reconstructions through the pulmonary arteries per protocol.) CT scanning was performed using radiation dose  reduction techniques when appropriate, per system protocols. Routine protocol (Axial portal venous phase imaging from the lung bases to the pubic symphysis following IV contrast with coronal reconstruction.) CT scanning was performed using radiation dose reduction techniques when appropriate, per system protocols. CT scanning was performed using radiation dose reduction techniques when appropriate, per system protocols. FINDINGS: Vascular: No pulmonary embolism. Motion artifact in the lung bases limits evaluation. Lower Neck: Unremarkable. Airway: Patent. Lungs: No mass or consolidation. 2 mm nodule  in the medial right lower lobe scarring versus atelectasis in the lingula and lower lobes. Pleura: No pneumothorax. No effusion. Heart: No pericardial effusion. Mediastinum/Nodes: No adenopathy. Bones: No concerning osseous abnormality. Body Wall: Unremarkable. Motion artifact limits evaluation of the upper abdomen. Hepatobiliary: No hepatic mass.  No biliary dilatation. Gallbladder: Unremarkable Pancreas: No mass or ductal dilation. Spleen: Unremarkable. GI: Mild rectosigmoid wall thickening. No obstruction. Peritoneum/Retroperitoneum: Unremarkable Adrenal: Unremarkable. Renal/Urinary: No mass or hydronephrosis. Left renal cysts. Reproductive: Unremarkable. Lymph Nodes: No adenopathy. Bones: No concerning osseous lesions. Body Wall: Unremarkable. Vascular: Portal vein is patent. May Thurner configuration.     No pulmonary embolus. Mild rectosigmoid colonic wall thickening, could reflect proctocolitis the appropriate clinical context. Recommend follow-up colonoscopy if not recently performed.    Transthoracic echocardiogram (TTE) complete with contrast, bubble, strain, and 3D PRN    Result Date: 09/25/2021    Left Ventricle: Normal left ventricular systolic function with a visually estimated EF of 60 - 65%. Left ventricle size is normal. Normal wall thickness. Diastolic dysfunction present with normal LV EF.   Aortic Valve: Trileaflet valve. Mild sclerosis of the aortic valve cusp.   Mitral Valve: Mildly thickened leaflet.   Left Atrium: Left atrium is mildly dilated.     Cardiac procedure    Result Date: 09/24/2021    Normal coronary arteries   Normal LVEDP       Labs:   Recent Results (from the past 72 hour(s))   CBC    Collection Time: 09/23/21  7:42 PM   Result Value Ref Range    WBC 9.7 3.8 - 10.6 x10e3/mcL    RBC 4.22 3.60 - 5.20 x10e6/mcL    Hemoglobin 12.9 11.5 - 15.7 g/dL    Hematocrit 38.7 34.0 - 47.0 %    MCV 91.7 81.0 - 99.0 fL    MCH 30.6 27.0 - 34.5 pg    MCHC 33.3 32.0 - 36.0 g/dL    RDW 12.1 11.0 - 16.0  %    Platelets 174 140 - 440 x10e3/mcL    MPV 11.6 7.2 - 13.2 fL    NRBC Automated 0.0 0.0 - 0.2 %    NRBC Absolute 0.000 0.000 - 0.012 x10e3/mcL   Comprehensive Metabolic Panel    Collection Time: 09/23/21  7:42 PM   Result Value Ref Range    Sodium 141 135 - 145 mmol/L    Potassium 3.9 3.5 - 5.3 mmol/L    Chloride 104 98 - 107 mmol/L    CO2 29 22 - 29 mmol/L    Glucose 108 (H) 70 - 99 mg/dL    BUN 13 8 - 23 mg/dL    Creatinine 0.7 0.5 - 1.0 mg/dL    Anion Gap 8 2 - 17 mmol/L    OSMOLALITY CALCULATED 282 270 - 287 mOsm/kg    Calcium 9.2 8.8 - 10.2 mg/dL    Total Protein 7.1 6.4 - 8.3 g/dL    Albumin 4.1 3.5 - 5.2 g/dL    Globulin 3.0 1.9 - 4.4 g/dL    Albumin/Globulin Ratio 1.37 1.00 - 2.70    Total Bilirubin 0.20 0.00 - 1.20 mg/dL    Alk Phosphatase 74 35 - 117 unit/L    AST 23 0 - 35 unit/L    ALT 24 0 - 35 unit/L    Est, Glom Filt Rate 97 >=90 mL/min/1.49m?   Troponin    Collection Time: 09/23/21  7:42 PM   Result Value Ref Range    Troponin T <0.010 0.000 - 0.010 ng/mL   Troponin    Collection Time: 09/24/21  5:51 AM   Result Value Ref Range    Troponin T <0.010 0.000 - 0.010 ng/mL   EKG 12 Lead    Collection Time: 09/24/21  6:11 AM  Result Value Ref Range    Ventricular Rate 59 BPM    P-R Interval 195 ms    QRS Duration 149 ms    Q-T Interval 443 ms    QTc Calculation (Bazett) 442 ms    P Axis 72 degrees    R Axis 66 degrees    T Axis 70 degrees    Diagnosis       SINUS BRADYCARDIA  LEFT BUNDLE BRANCH BLOCK  [120+ ms QRS DURATION, 80+ ms Q/S IN V1/V2, 85+ ms   R IN I/aVL/V5/V6]  ABNORMAL ECG  Reviewed by _________________     EKG 12 Lead    Collection Time: 09/24/21 11:02 AM   Result Value Ref Range    Ventricular Rate 61 BPM    P-R Interval 176 ms    QRS Duration 130 ms    Q-T Interval 410 ms    QTc Calculation (Bazett) 413 ms    P Axis 60 degrees    R Axis 67 degrees    T Axis 53 degrees    Diagnosis       SINUS RHYTHM  LEFT BUNDLE BRANCH BLOCK [120+ MS QRS DURATION, 80+ MS Q/S IN V1/V2, 85+ MS R  IN  I/AVL/V5/V6]  ABNORMAL ECG     CBC    Collection Time: 09/24/21 11:06 AM   Result Value Ref Range    WBC 6.5 3.8 - 10.6 x10e3/mcL    RBC 4.20 3.60 - 5.20 x10e6/mcL    Hemoglobin 12.6 11.5 - 15.7 g/dL    Hematocrit 38.0 34.0 - 47.0 %    MCV 90.5 81.0 - 99.0 fL    MCH 30.0 27.0 - 34.5 pg    MCHC 33.2 32.0 - 36.0 g/dL    RDW 12.1 11.0 - 16.0 %    Platelets 172 140 - 440 x10e3/mcL    MPV 12.2 7.2 - 13.2 fL    NRBC Automated 0.0 0.0 - 0.2 %    NRBC Absolute 0.000 0.000 - 0.012 x10e3/mcL   APTT    Collection Time: 09/24/21 11:06 AM   Result Value Ref Range    PTT 30.1 23.3 - 34.5 seconds   Cardiac procedure    Collection Time: 09/24/21 12:40 PM   Result Value Ref Range    Body Surface Area 1.52 m2   Comprehensive Metabolic Panel w/ Reflex to MG    Collection Time: 09/25/21  4:02 AM   Result Value Ref Range    Sodium 140 135 - 145 mmol/L    Potassium 3.7 3.5 - 5.3 mmol/L    Chloride 104 98 - 107 mmol/L    CO2 29 22 - 29 mmol/L    Glucose 98 70 - 99 mg/dL    BUN 15 8 - 23 mg/dL    Creatinine 0.6 0.5 - 1.0 mg/dL    Anion Gap 7 2 - 17 mmol/L    OSMOLALITY CALCULATED 280 270 - 287 mOsm/kg    Calcium 9.1 8.8 - 10.2 mg/dL    CALCIUM,CORRECTED,CCA 9.3 8.8 - 10.2 mg/dL    Total Protein 6.6 6.4 - 8.3 g/dL    Albumin 3.8 3.5 - 5.2 g/dL    Globulin 2.8 1.9 - 4.4 g/dL    Albumin/Globulin Ratio 1.40 1.00 - 2.70    Total Bilirubin 0.38 0.00 - 1.20 mg/dL    Alk Phosphatase 66 35 - 117 unit/L    AST 22 0 - 35 unit/L    ALT 20 0 - 35 unit/L  Est, Glom Filt Rate 101 >=90 mL/min/1.70m?   CBC with Auto Differential    Collection Time: 09/25/21  4:02 AM   Result Value Ref Range    WBC 7.7 3.8 - 10.6 x10e3/mcL    RBC 4.13 3.60 - 5.20 x10e6/mcL    Hemoglobin 12.7 11.5 - 15.7 g/dL    Hematocrit 37.1 34.0 - 47.0 %    MCV 89.8 81.0 - 99.0 fL    MCH 30.8 27.0 - 34.5 pg    MCHC 34.2 32.0 - 36.0 g/dL    RDW 12.1 11.0 - 16.0 %    Platelets 156 140 - 440 x10e3/mcL    MPV 12.7 7.2 - 13.2 fL    NRBC Automated 0.0 0.0 - 0.2 %    NRBC Absolute 0.000 0.000  - 0.012 x10e3/mcL    Neutrophils % 54.0 42.0 - 74.0 %    Lymphocytes 33.8 15.0 - 45.0 %    Monocytes 9.8 4.0 - 12.0 %    Eosinophils % 1.0 0.0 - 7.0 %    Basophils % 0.9 0.0 - 2.0 %    Neutrophils Absolute 4.1 1.6 - 7.3 x10e3/mcL    Absolute Lymph # 2.6 1.0 - 3.2 x10e3/mcL    Absolute Mono # 0.8 0.3 - 1.0 x10e3/mcL    Absolute Eos # 0.1 0.0 - 0.5 x10e3/mcL    Absolute Baso # 0.1 0.0 - 0.2 x10e3/mcL    Immature Granulocytes 0.5 0.0 - 0.6 %    Immature Grans (Abs) 0.04 0.00 - 0.06 x10e3/mcL   APTT    Collection Time: 09/25/21  4:02 AM   Result Value Ref Range    PTT 41.5 (H) 23.3 - 34.5 seconds   EKG 12 Lead    Collection Time: 09/25/21  9:58 AM   Result Value Ref Range    Ventricular Rate 65 BPM    P-R Interval 184 ms    QRS Duration 133 ms    Q-T Interval 404 ms    QTc Calculation (Bazett) 422 ms    P Axis 55 degrees    R Axis -4 degrees    T Axis 34 degrees    Diagnosis       SINUS RHYTHM  LEFT BUNDLE BRANCH BLOCK  [120+ ms QRS DURATION, 80+ ms Q/S IN V1/V2, 85+ ms   R IN I/aVL/V5/V6]  ABNORMAL ECG  INTERPRETATION BASED ON A DEFAULT AGE OF 40 YEARS  Reviewed by _________________     Transthoracic echocardiogram (TTE) complete with contrast, bubble, strain, and 3D PRN    Collection Time: 09/25/21 10:30 AM   Result Value Ref Range    LV EDV A4C 72 mL    LV ESV A4C 32 mL    IVSd 1.2 (A) 0.6 - 0.9 cm    LVIDd 4.3 3.9 - 5.3 cm    LVOT Diameter 2.0 cm    LVOT Mean Gradient 2 mmHg    LVOT VTI 20.9 cm    LVOT Peak Velocity 0.9 m/s    LVOT Peak Gradient 3 mmHg    LVPWd 0.9 0.6 - 0.9 cm    LV Ejection Fraction A4C 56 %    LVOT Area 3.1 cm2    LVOT SV 65.6 ml    LA Major Axis 6.0 cm    LA Area 4C 16.0 cm2    LA Volume 4C 34 22 - 52 mL    LA Diameter 2.6 cm    AV Mean Gradient 7 mmHg    AV VTI 36.3  cm    AV Mean Velocity 1.2 m/s    AV Peak Velocity 1.8 m/s    AV Peak Gradient 12 mmHg    AV Area by VTI 1.8 cm2    AV Area by Peak Velocity 1.6 cm2    Aortic Root 2.9 cm    Ascending Aorta 3.2 cm    IVC Expiration 1.3 cm    MR Peak  Velocity 2.3 m/s    MR Peak Gradient 21 mmHg    MV E Wave Deceleration Time 167.0 ms    MV A Velocity 0.84 m/s    MV E Velocity 0.72 m/s    RVIDd 4.7 cm    RV Free Wall Peak S' 15 cm/s    TAPSE 2.7 1.7 cm    TR Max Velocity 2.61 m/s    TR Peak Gradient 27 mmHg    Body Surface Area 1.52 m2    LV ESV Index A4C 21 mL/m2    LV EDV Index A4C 47 mL/m2    LVIDd Index 2.83 cm/m2    LV RWT Ratio 0.42     LV Mass 2D 152.6 67 - 162 g    LV Mass 2D Index 100.4 (A) 43 - 95 g/m2    MV E/A 0.86     LVOT Stroke Volume Index 43.2 mL/m2    LA Volume Index 4C 22 16 - 34 mL/m2    LA Size Index 1.71 cm/m2    LA/AO Root Ratio 0.90     Ao Root Index 1.91 cm/m2    Ascending Aorta Index 2.11 cm/m2    AV Velocity Ratio 0.50     LVOT:AV VTI Index 0.58     AVA/BSA VTI 1.2 cm2/m2    AVA/BSA Peak Velocity 1.1 cm2/m2    Est. RA Pressure 3 mmHg    RVSP 30 mmHg   APTT    Collection Time: 09/25/21 11:30 AM   Result Value Ref Range    PTT 35.4 (H) 23.3 - 34.5 seconds       Discharge condition: good  Disposition: Home  Time spent on discharge: >67mn    Electronically signed by CJanit Bern DO on 09/25/21 at 4:03 PM EST

## 2021-09-25 NOTE — Care Coordination-Inpatient (Signed)
09/25/21 1109   Service Assessment   Patient Orientation Alert and Oriented;Person   Cognition Alert   History Provided By Child/Family   Primary Caregiver Family   Accompanied By/Relationship Son Grayson   Support Systems Children;Family Members   Patient's Healthcare Decision Maker is: Armed forces operational officer Next of Kin   PCP Verified by CM No  (Visiting from [place])   Prior Functional Level Independent in ADLs/IADLs   Current Functional Level Independent in ADLs/IADLs   Can patient return to prior living arrangement Yes   Ability to make needs known: Good   Family able to assist with home care needs: Yes   Financial Resources None   Social/Functional History   Type of Home House   Home Layout Two level   Home Access Level entry   Information systems manager   Receives Help From Family   ADL Assistance Independent   Homemaking Assistance Independent   Ambulation Assistance Independent   Transfer Assistance Independent   Active Driver No   Patient's Driver Info Son [place]   Mode of Engineer, water   Discharge Planning   Type of Residence House   Living Arrangements Other (Comment)  (visiting son from [place])   Potential Assistance Needed N/A   DME Ordered? No   Potential Assistance Purchasing Medications No   Type of Home Care Services None   Patient expects to be discharged to: [facility] At/After Discharge   Transition of Care Consult (CM Consult) N/A   Services At/After Discharge None   [employer] Information Provided? No   Mode of Transport at Discharge Self   Confirm Follow Up Transport Family   Condition of Participation: Discharge Planning   The Plan for Transition of Care is related to the following treatment goals: Home with family   Pt admitted for chest pain. Pt is visiting her son from [place] for the next 4 months. Pt. Uses [facility]  pharmacy for her rx needs. Pt. Does not have a PCP as she is visiting from [place]. Pt is IADLS and has no dme's. No CM dc needs anticipated  at discharge. CM to follow.

## 2021-09-26 LAB — EKG 12-LEAD
P Axis: 55 degrees
P Axis: 72 degrees
P-R Interval: 184 ms
P-R Interval: 195 ms
Q-T Interval: 404 ms
Q-T Interval: 443 ms
QRS Duration: 133 ms
QRS Duration: 149 ms
QTc Calculation (Bazett): 422 ms
QTc Calculation (Bazett): 442 ms
R Axis: -4 degrees
R Axis: 66 degrees
T Axis: 34 degrees
T Axis: 70 degrees
Ventricular Rate: 65 {beats}/min
Ventricular Rate: 59 {beats}/min

## 2021-09-27 LAB — EKG 12-LEAD
P Axis: 60 degrees
P-R Interval: 176 ms
Q-T Interval: 410 ms
QRS Duration: 130 ms
QTc Calculation (Bazett): 413 ms
R Axis: 67 degrees
T Axis: 53 degrees
Ventricular Rate: 61 {beats}/min

## 2021-10-01 ENCOUNTER — Ambulatory Visit: Admit: 2021-10-01 | Discharge: 2021-10-01 | Payer: PRIVATE HEALTH INSURANCE | Attending: Physician Assistant

## 2021-10-01 DIAGNOSIS — S40021A Contusion of right upper arm, initial encounter: Secondary | ICD-10-CM

## 2021-10-01 DIAGNOSIS — M79601 Pain in right arm: Secondary | ICD-10-CM

## 2021-10-01 NOTE — Progress Notes (Signed)
Isabella Ortiz (DOB:  [birth year]) is a 66 y.o. female, here for evaluation of the following chief complaint(s):  No chief complaint on file.         ASSESSMENT/PLAN:  1. Right arm pain  2. History of cardiac cath    No results found for any visits on 10/01/21.     66 y.o. female with right arm pain s/p heart cath about 1 week ago.  Pt reports having pain the day of the the cath.  Pain radiates up her arm and into her neck.  She also has some bruising to her lower arm.  Tenderness to right arm and into neck.  No erythema.  Bruising present to lower arm.  Recommended going to the ER for further evaluation.            SUBJECTIVE/OBJECTIVE:    Arm Pain     66 y.o. female presenting for right arm pain s/p heart cath about 1 week ago.  Pt reports having pain the day of the the cath.  Pain radiates up her arm and into her neck.  She also has some bruising to her lower arm.          No Known Allergies   Current Outpatient Medications   Medication Sig Dispense Refill    aluminum & magnesium hydroxide-simethicone (MAALOX) 200-200-20 MG/5ML SUSP suspension Take 30 mLs by mouth every 6 hours as needed for Indigestion 10 each 0    esomeprazole Magnesium (NEXIUM) 20 MG PACK Take 1 packet by mouth 2 times daily 60 each 0    sucralfate (CARAFATE) 1 GM tablet Take 1 tablet by mouth every 8 hours 60 tablet 0    valsartan (DIOVAN) 80 MG tablet Take 100 mg by mouth daily      amLODIPine (NORVASC) 5 MG tablet Take 5 mg by mouth daily      aspirin 325 MG tablet Take 100 mg by mouth daily      simvastatin (ZOCOR) 20 MG tablet Take 20 mg by mouth nightly      ondansetron (ZOFRAN-ODT) 4 MG disintegrating tablet Take 1 tablet by mouth every 8 hours as needed for Nausea or Vomiting (Patient not taking: Reported on 10/01/2021) 10 tablet 0     No current facility-administered medications for this visit.     Facility-Administered Medications Ordered in Other Visits   Medication Dose Route Frequency Provider Last Rate Last Admin    acetaminophen  (TYLENOL) tablet 650 mg  650 mg Oral Q4H PRN Cyril Mourning, MD          No past medical history on file.     Review of Systems  Negative other than noted in HPI.         Objective   BP 105/68 (Site: Left Upper Arm, Position: Sitting, Cuff Size: Medium Adult)    Pulse 67    Temp 98.4 ??F (36.9 ??C) (Oral)    Resp 16    Wt 120 lb (54.4 kg)    SpO2 98%    BMI 21.26 kg/m??    Physical Exam  Constitutional:       Appearance: She is not ill-appearing or toxic-appearing.   HENT:      Head: Normocephalic and atraumatic.   Eyes:      Extraocular Movements: Extraocular movements intact.      Conjunctiva/sclera: Conjunctivae normal.      Pupils: Pupils are equal, round, and reactive to light.   Cardiovascular:      Rate and Rhythm:  Normal rate and regular rhythm.   Pulmonary:      Effort: Pulmonary effort is normal.      Breath sounds: Normal breath sounds.   Musculoskeletal:        Arms:       Cervical back: Normal range of motion. No rigidity.   Neurological:      General: No focal deficit present.      Mental Status: She is alert and oriented to person, place, and time. Mental status is at baseline.                    An electronic signature was used to authenticate this note.    --Lenise Arena, PA-C

## 2021-10-01 NOTE — Discharge Instructions (Signed)
Return tomorrow morning on him for vascular ultrasound.

## 2021-10-01 NOTE — ED Provider Notes (Signed)
[facility] EMERGENCY DEPT  EMERGENCY DEPARTMENT ENCOUNTER      Pt Name: Isabella Ortiz  MRN: [id number]  Birthdate 1956-01-27  Date of evaluation: 10/01/2021  Provider: Nanetta Batty, MD    CHIEF COMPLAINT       Chief Complaint   Patient presents with    Arm Pain     Patient comes to ER for pain to her right arm.  She had a heart cath procedure 01/26 at [facility].         HISTORY OF PRESENT ILLNESS    HPI    Patient presents with chief complaint of right upper extremity pain, had a history of a heart cath with a right radial wrist approach, approximately 6 days ago, hematoma noted on the wrist, was seen in urgent care today for evaluation and sent her to the ED for likely vascular ultrasound, there is no redness minimal swelling patient states she has significant pain however on examination it is very minimal.  There is no fevers chills or cough there is no shortness of breath there is no nausea vomiting the patient also has occasional right-sided neck pain.  She has no slurred speech facial droop unilateral numbness tingling or weakness.  Patient on full dose aspirin.  Symptoms do not radiate worse with palpation movement relieved at rest    Nursing Notes were reviewed.  REVIEW OF SYSTEMS     Review of Systems   Constitutional:  Negative for chills and fever.   Respiratory:  Negative for cough.    Musculoskeletal:  Positive for myalgias. Negative for arthralgias, back pain and joint swelling.        Right wrist and arm painful   Skin:  Positive for color change.   Neurological:  Negative for weakness and numbness.   All other systems reviewed and are negative.    Except as noted above the remainder of the review of systems was reviewed and negative.     PAST MEDICAL HISTORY   History reviewed. No pertinent past medical history.    SURGICAL HISTORY       Past Surgical History:   Procedure Laterality Date    CARDIAC PROCEDURE N/A 09/24/2021    Left heart cath / coronary angiography performed by Cyril Mourning, MD at  [facility] CARDIAC CATH/EP [facility]       CURRENT MEDICATIONS       Previous Medications    ALUMINUM & MAGNESIUM HYDROXIDE-SIMETHICONE (MAALOX) 200-200-20 MG/5ML SUSP SUSPENSION    Take 30 mLs by mouth every 6 hours as needed for Indigestion    AMLODIPINE (NORVASC) 5 MG TABLET    Take 5 mg by mouth daily    ASPIRIN 325 MG TABLET    Take 100 mg by mouth daily    ESOMEPRAZOLE MAGNESIUM (NEXIUM) 20 MG PACK    Take 1 packet by mouth 2 times daily    ONDANSETRON (ZOFRAN-ODT) 4 MG DISINTEGRATING TABLET    Take 1 tablet by mouth every 8 hours as needed for Nausea or Vomiting    SIMVASTATIN (ZOCOR) 20 MG TABLET    Take 20 mg by mouth nightly    SUCRALFATE (CARAFATE) 1 GM TABLET    Take 1 tablet by mouth every 8 hours    VALSARTAN (DIOVAN) 80 MG TABLET    Take 100 mg by mouth daily       ALLERGIES     Patient has no known allergies.    FAMILY HISTORY     No family history on  file.     SOCIAL HISTORY       Social History     Socioeconomic History    Marital status: Married     Spouse name: None    Number of children: None    Years of education: None    Highest education level: None   Tobacco Use    Smoking status: Never    Smokeless tobacco: Never   Substance and Sexual Activity    Alcohol use: Never    Drug use: Never       SCREENINGS         Glasgow Coma Scale  Eye Opening: Spontaneous  Best Verbal Response: Oriented  Best Motor Response: Obeys commands  Glasgow Coma Scale Score: 15                     CIWA Assessment  BP: 116/65  Heart Rate: 65                 PHYSICAL EXAM    (up to 7 for level 4, 8 or more for level 5)     ED Triage Vitals [10/01/21 1944]   BP Temp Temp src Heart Rate Resp SpO2 Height Weight   116/65 98.2 ??F (36.8 ??C) -- 65 16 98 % 5\' 3"  (1.6 m) 120 lb (54.4 kg)       Physical Exam  Vitals and nursing note reviewed.   Constitutional:       Appearance: Normal appearance.   HENT:      Head: Normocephalic and atraumatic.   Cardiovascular:      Rate and Rhythm: Normal rate and regular rhythm.      Pulses: Normal pulses.    Pulmonary:      Effort: Pulmonary effort is normal.      Breath sounds: Normal breath sounds.   Musculoskeletal:         General: Normal range of motion.      Cervical back: Normal range of motion and neck supple.   Skin:     General: Skin is warm and dry.      Comments: Regular extremities full range of motion of the shoulder elbow and wrist, there is a hematoma noted at the right wrist distal radial area, no palpable deep clot full range of motion at the wrist without evidence of swelling, there is no palpable cord, and the right arm   Neurological:      General: No focal deficit present.      Mental Status: She is alert and oriented to person, place, and time. Mental status is at baseline.   Psychiatric:         Mood and Affect: Mood normal.       DIAGNOSTIC RESULTS     EMERGENCY DEPARTMENT COURSE/REASSESSMENT and MDM:   Vitals:    Vitals:    10/01/21 1944   BP: 116/65   Pulse: 65   Resp: 16   Temp: 98.2 ??F (36.8 ??C)   SpO2: 98%   Weight: 54.4 kg   Height: 5\' 3"  (1.6 m)       ED Course:       Medical Decision Making  Risk  Prescription drug management.    Patient with right upper extremity hematoma status post vascular procedure, will place vascular ultrasound order for the morning place patient in Velcro wrist splint overnight and as needed, this may help with her hematoma resolution as it will give some compression and support, will recommend  nonsteroidals and close outpatient follow-up tomorrow.      FINAL IMPRESSION      1. Hematoma of arm, right, initial encounter          DISPOSITION/PLAN   DISPOSITION Decision To Discharge 10/01/2021 08:02:10 PM      PATIENT REFERRED TO:  [facility] EMERGENCY DEPT  [address]  [place] [address]  [phone]  In 1 day        DISCHARGE MEDICATIONS:  New Prescriptions    No medications on file     Controlled Substances Monitoring:     No flowsheet data found.    (Please note that portions of this note were completed with a voice recognition  program.  Efforts were made to edit the dictations but occasionally words are mis-transcribed.)    Nanetta Batty, MD (electronically signed)  Attending Emergency Physician          Nanetta Batty, MD  10/01/21 2010

## 2021-10-02 ENCOUNTER — Emergency Department: Admit: 2021-10-02 | Payer: PRIVATE HEALTH INSURANCE

## 2021-10-02 ENCOUNTER — Inpatient Hospital Stay
Admit: 2021-10-02 | Discharge: 2021-10-02 | Disposition: A | Discharge status: Home / Self Care | Payer: PRIVATE HEALTH INSURANCE

## 2021-10-02 ENCOUNTER — Inpatient Hospital Stay
Admit: 2021-10-02 | Discharge: 2021-10-02 | Disposition: A | Discharge status: Home / Self Care | Payer: PRIVATE HEALTH INSURANCE | Attending: Emergency Medicine

## 2021-10-02 DIAGNOSIS — T148XXA Other injury of unspecified body region, initial encounter: Secondary | ICD-10-CM

## 2021-10-02 DIAGNOSIS — S60211A Contusion of right wrist, initial encounter: Secondary | ICD-10-CM

## 2021-10-02 MED ORDER — IBUPROFEN 400 MG PO TABS
400 MG | ORAL | Status: DC
Start: 2021-10-02 — End: 2021-10-01

## 2021-10-02 MED ORDER — HYDROCODONE-ACETAMINOPHEN 5-325 MG PO TABS
5-325 MG | ORAL_TABLET | Freq: Four times a day (QID) | ORAL | 0 refills | Status: AC | PRN
Start: 2021-10-02 — End: 2021-10-07

## 2021-10-02 MED ORDER — IBUPROFEN 400 MG PO TABS
400 MG | ORAL_TABLET | Freq: Three times a day (TID) | ORAL | 0 refills | Status: AC | PRN
Start: 2021-10-02 — End: ?

## 2021-10-02 NOTE — Discharge Instructions (Signed)
Keep a pressure dressing on the area. Roll the 4x4 gauze into a tube, then apply longitudinally and secure down tightly with the stretchy tape. You may remove this to bathe, but then reapply. If your hand becomes more painful or cold, return to the emergency department. Follow up with Dr. Zada Girt with vascular surgery next week. Otherwise return to the emergency department for any worsening symptoms, problems or concerns.     aihtafaz bidamadat aldaght ealaa almintaqati. lafi alshaash 4x4 fi 'unbub , thuma daeh twlyan wathabath bi'iihkam biaistikhdam alsharit almataati. yumkinuk 'iizalat hadha lilaistihmam , walakin baed dhalik 'iieadat taqdim altalabi. 'iidha 'asbahat yaduk 'akthar 'iylaman 'aw burudatan , fairjie 'iilaa qism altawarii. Gabrielle Dare mae duktur artharz fi jirahat al'aweiat aldamawiat al'usbue almuqbila. wa'iilaa farjie 'iilaa qism altawari limaerifat 'ayi 'aerad 'aw mashakil 'aw makhawif tatafaqamu.    ????? ?????? ????? ??? ???????. ?? ????? 4x4 ?? ????? ? ?? ??? ?????? ????? ?????? ???????? ?????? ???????. ????? ????? ??? ????????? ? ???? ??? ??? ????? ????? ?????. ??? ????? ??? ???? ??????? ?? ????? ? ????? ??? ??? ???????. ???? ?? ????? ????? ?? ????? ??????? ??????? ??????? ??????. ???? ????? ??? ??? ??????? ?????? ?? ????? ?? ????? ?? ????? ??????.

## 2021-10-02 NOTE — ED Provider Notes (Addendum)
[facility] EMERGENCY DEPT  EMERGENCY DEPARTMENT ENCOUNTER      Pt Name: Isabella Ortiz  MRN: [id number]  Birthdate 1955-09-08  Date of evaluation: 10/02/2021  Provider: Philomena Doheny Orest Dygert, PA-C    CHIEF COMPLAINT       Chief Complaint   Patient presents with    Arm Pain     Pt returning for vascular study to r/o dvt to rt arm, was seen in er yesterday         HISTORY OF PRESENT ILLNESS   (Location/Symptom, Timing/Onset, Context/Setting, Quality, Duration, Modifying Factors, Severity)  Note limiting factors.       Patient is a 67 year old female who presents the emergency department with complaints of right arm pain.  Patient had recent heart cath, has had pain to her wrist at the insertion site since that occurred.  She presented last night and was told to return today for vascular ultrasound.  No other complaints at this time.        Nursing Notes were reviewed.    REVIEW OF SYSTEMS    (2-9 systems for level 4, 10 or more for level 5)     Review of Systems   Skin:  Positive for color change.   All other systems reviewed and are negative.    Except as noted above the remainder of the review of systems was reviewed and negative.       PAST MEDICAL HISTORY   No past medical history on file.      SURGICAL HISTORY       Past Surgical History:   Procedure Laterality Date    CARDIAC PROCEDURE N/A 09/24/2021    Left heart cath / coronary angiography performed by Cyril Mourning, MD at [facility] CARDIAC CATH/EP [facility]         CURRENT MEDICATIONS       Discharge Medication List as of 10/02/2021 12:09 PM        CONTINUE these medications which have NOT CHANGED    Details   ibuprofen (IBU) 400 MG tablet Take 1 tablet by mouth every 8 hours as needed for Pain, Disp-21 tablet, R-0Normal      esomeprazole Magnesium (NEXIUM) 20 MG PACK Take 1 packet by mouth 2 times daily, Disp-60 each, R-0Normal      sucralfate (CARAFATE) 1 GM tablet Take 1 tablet by mouth every 8 hours, Disp-60 tablet, R-0Normal      valsartan (DIOVAN) 80 MG tablet Take 100 mg by  mouth dailyHistorical Med      amLODIPine (NORVASC) 5 MG tablet Take 5 mg by mouth dailyHistorical Med      aspirin 325 MG tablet Take 100 mg by mouth dailyHistorical Med      simvastatin (ZOCOR) 20 MG tablet Take 20 mg by mouth nightlyHistorical Med             ALLERGIES     Patient has no known allergies.    FAMILY HISTORY     No family history on file.       SOCIAL HISTORY       Social History     Socioeconomic History    Marital status: Married   Tobacco Use    Smoking status: Never    Smokeless tobacco: Never   Substance and Sexual Activity    Alcohol use: Never    Drug use: Never       SCREENINGS         Glasgow Coma Scale  Eye Opening: Spontaneous  Best Verbal  Response: Oriented  Best Motor Response: Obeys commands  Glasgow Coma Scale Score: 15                     CIWA Assessment  BP: 121/65  Heart Rate: 68                 PHYSICAL EXAM    (up to 7 for level 4, 8 or more for level 5)     ED Triage Vitals [10/02/21 0911]   BP Temp Temp src Heart Rate Resp SpO2 Height Weight   121/65 97.5 ??F (36.4 ??C) -- 68 16 98 % -- --       Physical Exam  Vitals and nursing note reviewed.   Constitutional:       Appearance: Normal appearance.   HENT:      Head: Normocephalic and atraumatic.      Right Ear: External ear normal.      Left Ear: External ear normal.      Mouth/Throat:      Mouth: Mucous membranes are moist.   Eyes:      Extraocular Movements: Extraocular movements intact.   Cardiovascular:      Rate and Rhythm: Normal rate and regular rhythm.      Pulses: Normal pulses.   Pulmonary:      Effort: Pulmonary effort is normal. No respiratory distress.   Abdominal:      General: Abdomen is flat.   Musculoskeletal:         General: Normal range of motion.      Cervical back: Neck supple.      Comments: Right wrist has diffuse ecchymosis, pulsatile mass adjacent to the radial artery is small   Skin:     General: Skin is warm and dry.      Capillary Refill: Capillary refill takes less than 2 seconds.   Neurological:       General: No focal deficit present.      Mental Status: She is alert and oriented to person, place, and time.   Psychiatric:         Mood and Affect: Mood normal.         Behavior: Behavior normal.       DIAGNOSTIC RESULTS     EKG: All EKG's are interpreted by the Emergency Department Physician who either signs or Co-signs this chart in the absence of a cardiologist.      RADIOLOGY:   Non-plain film images such as CT, Ultrasound and MRI are read by the radiologist. Plain radiographic images are visualized and preliminarily interpreted by the emergency physician with the below findings:        Interpretation per the Radiologist below, if available at the time of this note:    Vascular duplex upper extremity pseudoaneurysm check right               ED BEDSIDE ULTRASOUND:   Performed by ED Physician - none    LABS:  Labs Reviewed - No data to display    All other labs were within normal range or not returned as of this dictation.    EMERGENCY DEPARTMENT COURSE and DIFFERENTIAL DIAGNOSIS/MDM:   Vitals:    Vitals:    10/02/21 0911   BP: 121/65   Pulse: 68   Resp: 16   Temp: 97.5 ??F (36.4 ??C)   SpO2: 98%         MDM  Number of Diagnoses or Management Options  Exposure to  external factor as cause of accidental injury, initial encounter  Hematoma  Right arm pain  Traumatic hematoma of right wrist, initial encounter  Unspecified place of occurrence  Diagnosis management comments: Differential diagnosis pseudoaneurysm, aneurysm, hematoma  Patient in the emergency department for evaluation of right wrist pain after heart cath, vascular ultrasound shows thrombosed pseudoaneurysm versus hematoma adjacent to the radial artery, discussed this with Dr. Zada GirtArthurs, vascular surgery who recommended pressure dressing and follow-up in the office next week.  Gave these instructions to the patient's son who expressed understanding and has no further questions at this time.  Discharged home in stable condition.       Amount and/or Complexity  of Data Reviewed  Tests in the radiology section of CPT??: reviewed          REASSESSMENT          CRITICAL CARE TIME       CONSULTS:  None    PROCEDURES:  Unless otherwise noted below, none     Procedures        FINAL IMPRESSION      1. Hematoma          DISPOSITION/PLAN   DISPOSITION Decision To Discharge 10/02/2021 12:08:00 PM      PATIENT REFERRED TO:  Bethann BerkshireZachary Marshall Arthurs, MD  [address]  [place] [id number]  [phone]          DISCHARGE MEDICATIONS:  Discharge Medication List as of 10/02/2021 12:09 PM        START taking these medications    Details   HYDROcodone-acetaminophen (NORCO) 5-325 MG per tablet Take 1 tablet by mouth every 6 hours as needed for Pain for up to 5 days. Max Daily Amount: 4 tablets, Disp-12 tablet, R-0Print           Controlled Substances Monitoring:     No flowsheet data found.    (Please note that portions of this note were completed with a voice recognition program.  Efforts were made to edit the dictations but occasionally words are mis-transcribed.)    Eliot FordHeather Nicole Gianlucas Evenson, PA-C (electronically signed)  Attending Emergency Physician            Eliot FordHeather Nicole Nahun Kronberg, PA-C  10/02/21 1247       Eliot FordHeather Nicole Toby Ayad, PA-C  11/05/21 [phone]

## 2021-10-03 LAB — VAS DUP UPPER EXTREMITY PSEUDOANEURYSM CHECK RIGHT
Right Radial A dist PSV: 74.6 cm/s
Right Radial A mid PSV: 63.6 cm/s
Right Radial A prox PSV: 69.1 cm/s

## 2021-10-10 ENCOUNTER — Encounter

## 2021-10-10 ENCOUNTER — Ambulatory Visit: Admit: 2021-10-10 | Discharge: 2021-10-10 | Payer: PRIVATE HEALTH INSURANCE

## 2021-10-10 ENCOUNTER — Ambulatory Visit: Admit: 2021-10-10 | Discharge: 2021-10-10 | Payer: PRIVATE HEALTH INSURANCE | Attending: Family Medicine

## 2021-10-10 DIAGNOSIS — M549 Dorsalgia, unspecified: Secondary | ICD-10-CM

## 2021-10-10 LAB — AMB POC URINALYSIS DIP STICK AUTO W/O MICRO
Bilirubin, Urine, POC: NEGATIVE
Glucose, Urine, POC: NEGATIVE
Ketones, Urine, POC: NEGATIVE
Leukocyte Esterase, Urine, POC: NEGATIVE
Nitrite, Urine, POC: NEGATIVE
Protein, Urine, POC: NEGATIVE
Specific Gravity, Urine, POC: 1.015 (ref 1.001–1.035)
Urobilinogen, POC: 0.2
pH, Urine, POC: 6 (ref 4.6–8.0)

## 2021-10-10 LAB — AMB POC COMPLETE CBC, AUTOMATED
Hematocrit, POC: 39.1 %
Hemoglobin, POC: 13.2 G/DL
Lymphocye Absolute, POC: 2.2
Lymphocyte %: 32 %
MCV: 90.1
Neutrophil %: 60.2 %
Neutrophil Absolute, POC: 4.2
Other WBC, POC: 0.5
Platelet Count, POC: 200 10*3/uL
RBC, POC: 4.34 M/uL
WBC, POC: 6.9 10*3/uL

## 2021-10-10 LAB — AMB POC BASIC METABOLIC PANEL
Anion Gap, POC: 5 mmol/l
BUN, POC: 18 MG/DL
CO2, POC: 25 mEq/L
Calcium, ionized, POC: 0.83 mmol/L
Chloride, POC: 113 MMOL/L
Creatinine, POC: 0.7 MG/DL
Glucose, POC: 88 MG/DL
Hematocrit, POC: 38 %
Hemoglobin, POC: 12.9 G/DL
Potassium, POC: 4 MMOL/L
Sodium, POC: 138 MMOL/L

## 2021-10-10 MED ORDER — DICLOFENAC SODIUM 1 % EX GEL
1 % | Freq: Four times a day (QID) | CUTANEOUS | 0 refills | Status: AC
Start: 2021-10-10 — End: 2021-10-25

## 2021-10-10 NOTE — Progress Notes (Signed)
Isabella Ortiz (DOB:  Sep 20, 1955) is a 66 y.o. female, presents for evaluation of the following chief complaint(s):  Back Pain        Subjective   SUBJECTIVE/OBJECTIVE:  Patient reports recurring mid back pain over the past few months which she says has been getting worse. Usually able to address by standing. This no longer helps as much.Patient denies fevr/chills, nausea diarrhea, vomiting or constipation.     PLEASE NOTE: Patient is visiting with her son. She is from [place] and reportedly speaks a dialect of Arabic which is particular to her region. Her son has served as interpreter with her approval.     BP 125/72 (Site: Right Upper Arm)    Pulse 62    Temp 98 ??F (36.7 ??C)    Ht 5' (1.524 m)    Wt 121 lb (54.9 kg)    SpO2 99%    BMI 23.63 kg/m??      No Known Allergies   Current Outpatient Medications   Medication Sig Dispense Refill    diclofenac sodium (VOLTAREN) 1 % GEL Apply 2 g topically 4 times daily for 15 days As needed for back pain 120 g 0    ibuprofen (IBU) 400 MG tablet Take 1 tablet by mouth every 8 hours as needed for Pain 21 tablet 0    esomeprazole Magnesium (NEXIUM) 20 MG PACK Take 1 packet by mouth 2 times daily 60 each 0    sucralfate (CARAFATE) 1 GM tablet Take 1 tablet by mouth every 8 hours 60 tablet 0    valsartan (DIOVAN) 80 MG tablet Take 100 mg by mouth daily      amLODIPine (NORVASC) 5 MG tablet Take 5 mg by mouth daily      aspirin 325 MG tablet Take 100 mg by mouth daily      simvastatin (ZOCOR) 20 MG tablet Take 20 mg by mouth nightly       No current facility-administered medications for this visit.     Facility-Administered Medications Ordered in Other Visits   Medication Dose Route Frequency Provider Last Rate Last Admin    acetaminophen (TYLENOL) tablet 650 mg  650 mg Oral Q4H PRN Cyril Mourning, MD          Past Medical History:   Diagnosis Date    GERD (gastroesophageal reflux disease)     Hyperlipidemia     Hypertension       Past Surgical History:   Procedure Laterality  Date    CARDIAC PROCEDURE N/A 09/24/2021    Left heart cath / coronary angiography performed by Cyril Mourning, MD at [facility]      No family history on file.   Social History     Occupational History    Not on file   Tobacco Use    Smoking status: Never    Smokeless tobacco: Never   Substance and Sexual Activity    Alcohol use: Never    Drug use: Never    Sexual activity: Not on file          Review of Systems   Constitutional:  Negative for activity change and fever.   HENT:  Negative for rhinorrhea.    Eyes: Negative.    Respiratory:  Negative for cough.    Cardiovascular: Negative.    Gastrointestinal: Negative.    Endocrine: Negative.    Genitourinary: Negative.    Musculoskeletal:  Positive for back pain.   Skin: Negative.    Allergic/Immunologic:  Negative.    Neurological:  Negative for weakness.   Hematological: Negative.    Psychiatric/Behavioral: Negative.          Objective   Physical Exam  Constitutional:       Appearance: Normal appearance.   HENT:      Head: Normocephalic and atraumatic.      Right Ear: External ear normal.      Left Ear: External ear normal.   Eyes:      Extraocular Movements: Extraocular movements intact.      Pupils: Pupils are equal, round, and reactive to light.   Cardiovascular:      Rate and Rhythm: Normal rate and regular rhythm.   Pulmonary:      Effort: Pulmonary effort is normal.   Musculoskeletal:         General: Normal range of motion.      Cervical back: Normal range of motion.        Back:       Comments: Patient reports tenderness to palpation over the paraspinous muscles in the thoracic back.  There is no CVA tenderness   Neurological:      General: No focal deficit present.      Mental Status: She is alert.   Psychiatric:         Mood and Affect: Mood normal.         Behavior: Behavior normal.             ASSESSMENT/PLAN:  1. Back pain, unspecified back location, unspecified back pain laterality, unspecified chronicity  -     AMB POC URINALYSIS DIP  STICK AUTO W/O MICRO  -     POC BASIC METABOLIC PANEL  -     XR THORACIC SPINE (3 VIEWS); Future  Comparison of today's labs with recent labs in chart show  all WNL. XR Thoracic spine shows no acute issues per Radiology. We have sent UA for further micro and culture. We will contact patient with results. She should seek ER care if symptoms worsen at any time. Patient and son express understanding and agreement.     2. Microhematuria  -     Culture, Urine; Future  -     POC BASIC METABOLIC PANEL  -     POC CBC w/Auto Diff (14782)  -     Urinalysis W/ Rflx Microscopic; Future    POC Urinalysis appears normal except for moderate blood noted. We have sent sample for Lab with reflex to microscope as well as sample for culture and sensitivity. We will contact patient with results. Patient has had recent treatment for UTI with antibiotics after being seen by Urologist with reported resolution of symptoms.       3. Other fatigue  -     POC BASIC METABOLIC PANEL  -     POC CBC w/Auto Diff (95621)    CBC and BMP performed in house both appear to be within normal limits.  Neither indicate anemia, elevated white blood cell count, hypoglycemia or hyperglycemia, low platelets, electrolyte dyscrasias or kidney disease.  We feel patient should be worked up more closely by PCP.  She states she will be going back to [place] in 2 months and will have that done there.      4. Chronic mid back pain  -     XR THORACIC SPINE (3 VIEWS); Future  -     diclofenac sodium (VOLTAREN) 1 % GEL; Apply 2 g topically 4 times daily  for 15 days As needed for back pain, Topical, 4 TIMES DAILY Starting Sat 10/10/2021, Until Sun 10/25/2021, For 15 days, Disp-120 g, R-0, Normal    X-ray shows no bony abnormality nor soft tissue swelling. Pain appears to be related to muscles strain. In discussing with patient we find she has been picking up heavy things on occasion. We counsel her regarding avoiding heavy lifting for now. Patient has been prescribed  Ibuprofen previously for this issue but does not take it because of issues with stomach upset. We will prescribe Diclofenac Gel topical to help and recommend icing in 30 minute intervals twice daily for the next five days. We suggest referral to PT however patient states she does not wish to do this. We can always refer of she changes her mind.      Return if symptoms worsen or fail to improve.           An electronic signature was used to authenticate this note.    --Kenton Kingfisher, MD

## 2021-10-11 LAB — MICROSCOPIC URINALYSIS
Amorphous, UA: NONE SEEN /HPF
BACTERIA, URINE: NONE SEEN

## 2021-10-11 LAB — URINALYSIS W/ RFLX MICROSCOPIC
Bilirubin Urine: NEGATIVE
Glucose, UA: NEGATIVE
Ketones, Urine: NEGATIVE mg/dL
Leukocyte Esterase, Urine: NEGATIVE
Nitrite, Urine: NEGATIVE
Protein, UA: NEGATIVE
Specific Gravity, UA: 1.017 (ref 1.003–1.035)
Urobilinogen, Urine: 0.2 EU/dL
pH, UA: 6 (ref 4.5–8.0)

## 2021-10-11 LAB — CULTURE, URINE: FINAL REPORT: NO GROWTH

## 2021-10-11 NOTE — Other (Signed)
I relayed negative culture and continued hematuria to patient's son. He will follow up with Urologist.  -- [facility]

## 2021-10-30 ENCOUNTER — Encounter

## 2021-11-19 ENCOUNTER — Inpatient Hospital Stay: Admit: 2021-11-19 | Payer: PRIVATE HEALTH INSURANCE

## 2021-11-19 ENCOUNTER — Encounter

## 2021-11-19 DIAGNOSIS — R1013 Epigastric pain: Secondary | ICD-10-CM

## 2021-11-19 LAB — COMPREHENSIVE METABOLIC PANEL
ALT: 17 U/L (ref 0–35)
AST: 23 U/L (ref 0–35)
Albumin/Globulin Ratio: 2 (ref 1.00–2.70)
Albumin: 4.2 g/dL (ref 3.5–5.2)
Alk Phosphatase: 72 U/L (ref 35–117)
Anion Gap: 10 mmol/L (ref 2–17)
BUN: 12 mg/dL (ref 8–23)
CO2: 28 mmol/L (ref 22–29)
Calcium: 9 mg/dL (ref 8.8–10.2)
Chloride: 104 mmol/L (ref 98–107)
Creatinine: 0.6 mg/dL (ref 0.5–1.0)
Est, Glom Filt Rate: 100 mL/min/1.73m (ref >=60)
Globulin: 3 g/dL (ref 1.9–4.4)
Glucose: 83 mg/dL (ref 70–99)
OSMOLALITY CALCULATED: 282 mOsm/kg (ref 270–287)
Potassium: 3.6 mmol/L (ref 3.5–5.3)
Sodium: 142 mmol/L (ref 135–145)
Total Bilirubin: 0.26 mg/dL (ref 0.00–1.20)
Total Protein: 6.8 g/dL (ref 6.4–8.3)

## 2021-11-19 LAB — CBC WITH AUTO DIFFERENTIAL
Absolute Baso #: 0 10*3/uL (ref 0.0–0.2)
Absolute Eos #: 0.1 10*3/uL (ref 0.0–0.5)
Absolute Lymph #: 2.4 10*3/uL (ref 1.0–3.2)
Absolute Mono #: 0.5 10*3/uL (ref 0.3–1.0)
Basophils %: 0.4 % (ref 0.0–2.0)
Eosinophils %: 1.5 % (ref 0.0–7.0)
Hematocrit: 35 % (ref 34.0–47.0)
Hemoglobin: 11.5 g/dL (ref 11.5–15.7)
Immature Grans (Abs): 0 10*3/uL (ref 0.00–0.06)
Immature Granulocytes: 0 % (ref 0.0–0.6)
Lymphocytes: 34.6 % (ref 15.0–45.0)
MCH: 30.7 pg (ref 27.0–34.5)
MCHC: 32.9 g/dL (ref 32.0–36.0)
MCV: 93.6 fL (ref 81.0–99.0)
MPV: 11.4 fL (ref 7.2–13.2)
Monocytes: 8 % (ref 4.0–12.0)
Neutrophils %: 55.5 % (ref 42.0–74.0)
Neutrophils Absolute: 3.8 10*3/uL (ref 1.6–7.3)
Platelets: 150 10*3/uL (ref 140–440)
RBC: 3.74 x10e6/mcL (ref 3.60–5.20)
RDW: 12.2 % (ref 11.0–16.0)
WBC: 6.8 10*3/uL (ref 3.8–10.6)

## 2021-11-19 LAB — AMYLASE: Amylase: 88 U/L (ref 28–100)

## 2021-11-19 LAB — LIPASE: Lipase: 20 U/L (ref 13–60)

## 2021-11-19 MED ORDER — TECHNETIUM TC 99M MEBROFENIN
Freq: Once | Status: AC | PRN
Start: 2021-11-19 — End: 2021-11-19
  Administered 2021-11-19: 13:00:00 8 via INTRAVENOUS

## 2021-11-19 MED ORDER — SODIUM CHLORIDE 0.9 % IV SOLN
0.9 % | Freq: Once | INTRAVENOUS | Status: AC
Start: 2021-11-19 — End: 2021-11-19
  Administered 2021-11-19: 14:00:00 1.04 ug/kg via INTRAVENOUS

## 2021-11-19 MED FILL — KINEVAC 5 MCG IJ SOLR: 5 MCG | INTRAMUSCULAR | Qty: 1.04

## 2021-11-27 DIAGNOSIS — J039 Acute tonsillitis, unspecified: Secondary | ICD-10-CM

## 2021-11-27 NOTE — ED Notes (Signed)
Translator pulled up in room. Per pt and son, pt cannot take any tylenol or ibuprofen above 200 mg without it upsetting her stomach. Aggie Cosier PA to bedside to clarify meds. After lengthy discussion, pt agrees to take liquid oral tylenol to relieve fever.      Isabella Murders, RN  11/27/21 2219

## 2021-11-27 NOTE — ED Provider Notes (Signed)
[facility] EMERGENCY DEPT  EMERGENCY DEPARTMENT ENCOUNTER      Pt Name: Isabella Ortiz  MRN: [id number]  Birthdate 11-Mar-1956  Date of evaluation: 11/27/2021  Provider: Vanice Sarah, PA-C    CHIEF COMPLAINT       Chief Complaint   Patient presents with    Fever     Pt states URI sx x3 days including sore throat, and fever.         HISTORY OF PRESENT ILLNESS   (Location/Symptom, Timing/Onset, Context/Setting, Quality, Duration, Modifying Factors, Severity)  Note limiting factors.   Isabella Ortiz is a 66 y.o. female who presents to the emergency department      66 y/o female with fever and sore throat x 3 days.  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 She has h/o chronic abdominal issues, and as a result, has difficulty tolerating PO medications (it causes abdominal pain or nausea).  Her son states that she is usually provided with a "Penicillin shot."  Patient c/o sore throat, HA, body aches.    The history is provided by the patient and a relative. The history is limited by a language barrier. A language interpreter was used.     Nursing Notes were reviewed.    REVIEW OF SYSTEMS    (2-9 systems for level 4, 10 or more for level 5)     Review of Systems   Constitutional:  Positive for fever.   HENT:  Positive for sore throat.    Gastrointestinal: Negative.    Musculoskeletal: Negative.    Skin: Negative.    Neurological:  Positive for headaches.   Psychiatric/Behavioral: Negative.       Except as noted above the remainder of the review of systems was reviewed and negative.       PAST MEDICAL HISTORY     Past Medical History:   Diagnosis Date    GERD (gastroesophageal reflux disease)     Hyperlipidemia     Hypertension          SURGICAL HISTORY       Past Surgical History:   Procedure Laterality Date    CARDIAC PROCEDURE N/A 09/24/2021    Left heart cath / coronary angiography performed by Cyril Mourning, MD at [facility]         CURRENT MEDICATIONS        Discharge Medication List as of 11/27/2021 10:19 PM        CONTINUE these medications which have NOT CHANGED    Details   diclofenac sodium (VOLTAREN) 1 % GEL Apply 2 g topically 4 times daily for 15 days As needed for back pain, Topical, 4 TIMES DAILY Starting Sat 10/10/2021, Until Sun 10/25/2021, For 15 days, Disp-120 g, R-0, Normal      ibuprofen (IBU) 400 MG tablet Take 1 tablet by mouth every 8 hours as needed for Pain, Disp-21 tablet, R-0Normal      esomeprazole Magnesium (NEXIUM) 20 MG PACK Take 1 packet by mouth 2 times daily, Disp-60 each, R-0Normal      sucralfate (CARAFATE) 1 GM tablet Take 1 tablet by mouth every 8 hours, Disp-60 tablet, R-0Normal      valsartan (DIOVAN) 80 MG tablet Take 100 mg by mouth dailyHistorical Med      amLODIPine (NORVASC) 5 MG tablet Take 5 mg by mouth dailyHistorical Med      aspirin 325 MG tablet Take 100 mg by mouth dailyHistorical Med  simvastatin (ZOCOR) 20 MG tablet Take 20 mg by mouth nightlyHistorical Med             ALLERGIES     Patient has no known allergies.    FAMILY HISTORY     No family history on file.       SOCIAL HISTORY       Social History     Socioeconomic History    Marital status: Married   Tobacco Use    Smoking status: Never    Smokeless tobacco: Never   Substance and Sexual Activity    Alcohol use: Never    Drug use: Never       SCREENINGS         Glasgow Coma Scale  Eye Opening: Spontaneous  Best Verbal Response: Oriented  Best Motor Response: Obeys commands  Glasgow Coma Scale Score: 15                     CIWA Assessment  BP: 130/60  Heart Rate: 82                 PHYSICAL EXAM    (up to 7 for level 4, 8 or more for level 5)     ED Triage Vitals [11/27/21 2054]   BP Temp Temp Source Heart Rate Resp SpO2 Height Weight   130/60 (!) 101.5 F (38.6 C) Oral 89 20 97 % -- 114 lb 10.2 oz (52 kg)       Physical Exam  Vitals and nursing note reviewed.   Constitutional:       Appearance: Normal appearance.   HENT:      Head: Normocephalic and  atraumatic.      Mouth/Throat:      Mouth: Mucous membranes are moist.      Pharynx: Pharyngeal swelling, oropharyngeal exudate and posterior oropharyngeal erythema present.      Tonsils: Tonsillar exudate present. 2+ on the right. 2+ on the left.   Pulmonary:      Effort: Pulmonary effort is normal. No respiratory distress.      Breath sounds: Normal breath sounds.   Musculoskeletal:         General: Normal range of motion.   Skin:     General: Skin is warm and dry.   Neurological:      Mental Status: She is alert and oriented to person, place, and time. Mental status is at baseline.   Psychiatric:         Mood and Affect: Mood normal.         Behavior: Behavior normal.       DIAGNOSTIC RESULTS     EKG: All EKG's are interpreted by the Emergency Department Physician who either signs or Co-signs this chart in the absence of a cardiologist.        RADIOLOGY:   Non-plain film images such as CT, Ultrasound and MRI are read by the radiologist. Plain radiographic images are visualized and preliminarily interpreted by the emergency physician with the below findings:        Interpretation per the Radiologist below, if available at the time of this note:    No orders to display         ED BEDSIDE ULTRASOUND:   Performed by ED Physician - none    LABS:  Labs Reviewed - No data to display    All other labs were within normal range or not returned as of this dictation.    EMERGENCY  DEPARTMENT COURSE and DIFFERENTIAL DIAGNOSIS/MDM:   Vitals:    Vitals:    11/27/21 2220 11/27/21 2225 11/27/21 2245 11/27/21 2250   BP:   130/60    Pulse: 93   82   Resp:       Temp:  (!) 103.6 F (39.8 C)  (!) 100.5 F (38.1 C)   TempSrc:  Oral  Oral   SpO2:   95%    Weight:               Medical Decision Making  Patient with h/o recurrent strep and clinically appears to have strep tonsillitis based on physical examination.  IM Bicillin administered.  Patient was agreeable to take an oral steroid (liquid), but initially refused Tylenol and Motrin  with concerns that it would worsen her chronic abdominal issues.  She was eventually agreeable to liquid Tyelnol    Risk  OTC drugs.  Prescription drug management.                FINAL IMPRESSION      1. Exudative tonsillitis    2. Fever in adult    3. H/O streptococcal pharyngitis          DISPOSITION/PLAN   DISPOSITION Decision To Discharge 11/27/2021 10:17:23 PM      PATIENT REFERRED TO:  Follow-up with your primary care physician    Schedule an appointment as soon as possible for a visit in 1 week      DISCHARGE MEDICATIONS:  Discharge Medication List as of 11/27/2021 10:19 PM        START taking these medications    Details   prednisoLONE (ORAPRED) 15 MG/5ML solution Take 5 mLs by mouth daily for 5 days, Disp-25 mL, R-0Print           Controlled Substances Monitoring:     No flowsheet data found.    (Please note that portions of this note were completed with a voice recognition program.  Efforts were made to edit the dictations but occasionally words are mis-transcribed.)    Sabria Florido Koleen Nimrod, PA-C (electronically signed)  Attending Emergency Physician           Vanice Sarah, PA-C  11/28/21 [phone]

## 2021-11-28 ENCOUNTER — Inpatient Hospital Stay
Admit: 2021-11-28 | Discharge: 2021-11-28 | Disposition: A | Discharge status: Home / Self Care | Payer: PRIVATE HEALTH INSURANCE

## 2021-11-28 MED ORDER — IBUPROFEN 200 MG PO TABS
200 MG | ORAL | Status: AC
Start: 2021-11-28 — End: 2021-11-28

## 2021-11-28 MED ORDER — ACETAMINOPHEN CHILDRENS 160 MG/5ML PO SOLN
1605 MG/5ML | ORAL | 0 refills | Status: AC | PRN
Start: 2021-11-28 — End: 2021-12-02

## 2021-11-28 MED ORDER — DEXAMETHASONE SOD PHOSPHATE PF 10 MG/ML IJ SOLN
10 MG/ML | Freq: Once | INTRAMUSCULAR | Status: AC
Start: 2021-11-28 — End: 2021-11-27
  Administered 2021-11-28: 02:00:00 10 mg via ORAL

## 2021-11-28 MED ORDER — IBUPROFEN 400 MG PO TABS
400 MG | Freq: Once | ORAL | Status: DC
Start: 2021-11-28 — End: 2021-11-27

## 2021-11-28 MED ORDER — PENICILLIN G BENZATHINE 1200000 UNIT/2ML IM SUSY
1200000 UNIT/2ML | Freq: Once | INTRAMUSCULAR | Status: AC
Start: 2021-11-28 — End: 2021-11-27
  Administered 2021-11-28: 02:00:00 1.2 10*6.[IU] via INTRAMUSCULAR

## 2021-11-28 MED ORDER — ACETAMINOPHEN 650 MG/20.3ML PO SOLN
650 MG/20.3ML | Freq: Once | ORAL | Status: AC
Start: 2021-11-28 — End: 2021-11-27
  Administered 2021-11-28: 02:00:00 650 mg via ORAL

## 2021-11-28 MED ORDER — PREDNISOLONE SODIUM PHOSPHATE 15 MG/5ML PO SOLN
15 MG/5ML | Freq: Every day | ORAL | 0 refills | Status: AC
Start: 2021-11-28 — End: 2021-12-02

## 2021-11-28 MED FILL — IBUPROFEN 200 MG PO TABS: 200 MG | ORAL | Qty: 2

## 2021-11-28 MED FILL — DEXAMETHASONE SOD PHOSPHATE PF 10 MG/ML IJ SOLN: 10 MG/ML | INTRAMUSCULAR | Qty: 1

## 2021-11-28 MED FILL — BICILLIN L-A 1200000 UNIT/2ML IM SUSY: 1200000 UNIT/2ML | INTRAMUSCULAR | Qty: 2

## 2021-11-28 MED FILL — ACETAMINOPHEN 650 MG/20.3ML PO SOLN: 650 MG/20.3ML | ORAL | Qty: 20.3

## 2021-11-28 MED FILL — IBUPROFEN 200 MG PO TABS: 200 MG | ORAL | Qty: 1

## 2021-12-27 ENCOUNTER — Emergency Department: Admit: 2021-12-27

## 2021-12-27 ENCOUNTER — Inpatient Hospital Stay
Admit: 2021-12-27 | Discharge: 2021-12-27 | Disposition: A | Discharge status: Home / Self Care | Attending: Emergency Medicine

## 2021-12-27 DIAGNOSIS — J029 Acute pharyngitis, unspecified: Secondary | ICD-10-CM

## 2021-12-27 LAB — CBC WITH AUTO DIFFERENTIAL
Absolute Baso #: 0.1 10*3/uL (ref 0.0–0.2)
Absolute Eos #: 0 10*3/uL (ref 0.0–0.5)
Absolute Lymph #: 2 10*3/uL (ref 1.0–3.2)
Absolute Mono #: 1.3 10*3/uL — ABNORMAL HIGH (ref 0.3–1.0)
Basophils %: 0.4 % (ref 0.0–2.0)
Eosinophils %: 0.3 % (ref 0.0–7.0)
Hematocrit: 36.7 % (ref 34.0–47.0)
Hemoglobin: 12.2 g/dL (ref 11.5–15.7)
Immature Grans (Abs): 0.04 10*3/uL (ref 0.00–0.06)
Immature Granulocytes: 0.3 % (ref 0.0–0.6)
Lymphocytes: 16.8 % (ref 15.0–45.0)
MCH: 30.1 pg (ref 27.0–34.5)
MCHC: 33.2 g/dL (ref 32.0–36.0)
MCV: 90.6 fL (ref 81.0–99.0)
MPV: 11.8 fL (ref 7.2–13.2)
Monocytes: 10.5 % (ref 4.0–12.0)
NRBC Absolute: 0 10*3/uL (ref 0.000–0.012)
NRBC Automated: 0 % (ref 0.0–0.2)
Neutrophils %: 71.7 % (ref 42.0–74.0)
Neutrophils Absolute: 8.5 10*3/uL — ABNORMAL HIGH (ref 1.6–7.3)
Platelets: 150 10*3/uL (ref 140–440)
RBC: 4.05 x10e6/mcL (ref 3.60–5.20)
RDW: 12.2 % (ref 11.0–16.0)
WBC: 11.9 10*3/uL — ABNORMAL HIGH (ref 3.8–10.6)

## 2021-12-27 LAB — BASIC METABOLIC PANEL
Anion Gap: 8 mmol/L (ref 2–17)
BUN: 12 mg/dL (ref 8–23)
CO2: 29 mmol/L (ref 22–29)
Calcium: 8.9 mg/dL (ref 8.8–10.2)
Chloride: 101 mmol/L (ref 98–107)
Creatinine: 0.7 mg/dL (ref 0.5–1.0)
Est, Glom Filt Rate: 97 mL/min/1.73m (ref >=60)
Glucose: 103 mg/dL — ABNORMAL HIGH (ref 70–99)
OSMOLALITY CALCULATED: 276 mOsm/kg (ref 270–287)
Potassium: 3.6 mmol/L (ref 3.5–5.3)
Sodium: 138 mmol/L (ref 135–145)

## 2021-12-27 LAB — CULTURE, BETA STREP CONFIRM PLATES

## 2021-12-27 LAB — URINALYSIS W/ RFLX MICROSCOPIC
Bilirubin Urine: NEGATIVE
Glucose, UA: NEGATIVE
Ketones, Urine: NEGATIVE
Nitrite, Urine: NEGATIVE
Protein, UA: 30 — AB
Specific Gravity, UA: 1.015 (ref 1.003–1.035)
Urobilinogen, Urine: 0.2 EU/dL
pH, UA: 6.5 (ref 4.5–8.0)

## 2021-12-27 LAB — RAPID STREP SCREEN
RAPID STREP RESULT: NEGATIVE
RAPID STREP RESULT: NEGATIVE

## 2021-12-27 LAB — COVID-19 & INFLUENZA COMBO (LIAT HOSPITAL)
INFLUENZA A: NOT DETECTED
INFLUENZA B: NOT DETECTED
SARS-CoV-2: NOT DETECTED

## 2021-12-27 LAB — MICROSCOPIC URINALYSIS

## 2021-12-27 MED ORDER — AMOXICILLIN-POT CLAVULANATE 875-125 MG PO TABS
875-125 MG | ORAL_TABLET | Freq: Two times a day (BID) | ORAL | 0 refills | Status: AC
Start: 2021-12-27 — End: 2022-01-06

## 2021-12-27 MED ORDER — DEXAMETHASONE SOD PHOSPHATE PF 10 MG/ML IJ SOLN
10 MG/ML | Freq: Once | INTRAMUSCULAR | Status: AC
Start: 2021-12-27 — End: 2021-12-27
  Administered 2021-12-27: 08:00:00 10 mg via INTRAVENOUS

## 2021-12-27 MED ORDER — KETOROLAC TROMETHAMINE 15 MG/ML IJ SOLN
15 MG/ML | Freq: Once | INTRAMUSCULAR | Status: AC
Start: 2021-12-27 — End: 2021-12-27
  Administered 2021-12-27: 08:00:00 15 mg via INTRAVENOUS

## 2021-12-27 MED ORDER — PENICILLIN G BENZATHINE 1200000 UNIT/2ML IM SUSY
1200000 UNIT/2ML | Freq: Once | INTRAMUSCULAR | Status: AC
Start: 2021-12-27 — End: 2021-12-27
  Administered 2021-12-27: 09:00:00 1.2 10*6.[IU] via INTRAMUSCULAR

## 2021-12-27 MED ORDER — IOPAMIDOL 61 % IV SOLN
61 % | Freq: Once | INTRAVENOUS | Status: AC | PRN
Start: 2021-12-27 — End: 2021-12-27
  Administered 2021-12-27: 08:00:00 100 mL via INTRAVENOUS

## 2021-12-27 MED FILL — DEXAMETHASONE SOD PHOSPHATE PF 10 MG/ML IJ SOLN: 10 MG/ML | INTRAMUSCULAR | Qty: 1

## 2021-12-27 MED FILL — BICILLIN L-A 1200000 UNIT/2ML IM SUSY: 1200000 UNIT/2ML | INTRAMUSCULAR | Qty: 2

## 2021-12-27 MED FILL — KETOROLAC TROMETHAMINE 15 MG/ML IJ SOLN: 15 MG/ML | INTRAMUSCULAR | Qty: 1

## 2021-12-27 NOTE — Discharge Instructions (Addendum)
Your laboratory work-up shows evidence of potential urinary tract infection, your COVID and flu and strep test were negative    Your CT scan did not show any new abnormality    Please follow-up with primary care for further evaluation    -Please return to medical care sooner if you are to develop any sudden worsening of symptoms including worsened pain, inability to stool or urinate, new or worsening fever, inability to tolerate anything to eat or drink by mouth, or any other features you deem to be concerning    Please return to the emergency room with any acute questions, concerns, worsening status or any delay to your outpatient care.    Thank you so much for visiting with [place] today.  You have had a screening emergency medical exam and to this point, no emergent condition has been identified.  It is important to realize that of course your visit today is simply a moment in time and that your medical condition can certainly change and become worse, necessitating re-evaluation in an urgent or emergent manner.  It is your responsibility to seek care in such instances.  It is also your responsibility to follow up with your primary care provider to discuss your Emergency Department visit and to go over any and all testing that was incurred during your Emergency Department visit.   We have made you aware of any pertinent results at this visit, however this may not have been comprehensive.  Lack of an acute emergency condition does not mean that there is not a problem, nor does it replace a thorough exam performed by a primary care physician.  It is your responsibility to follow your discharge instructions as given, to follow up with any other physicians as indicated, and to return to the Emergency Department as instructed.  Thank you again for visiting with [place] today, please let us know if you have any further questions.     You may also call [phone] for referral to physicians in your area.

## 2021-12-27 NOTE — Other (Signed)
Positive strep - G, sensitive to Amoxicillin, rx Amoxicillin.

## 2021-12-27 NOTE — ED Provider Notes (Signed)
[facility] EMERGENCY DEPT  EMERGENCY DEPARTMENT ENCOUNTER      Pt Name: Isabella Ortiz  MRN: [id number]  Birthdate 01/23/1956  Date of evaluation: 12/27/2021  Provider: Ramond Marrow, MD    CHIEF COMPLAINT       Chief Complaint   Patient presents with    Pharyngitis     Tstaes sore throat with fever that started yesterday--+ body aches and chills--also lower back pain--no ua problems         HISTORY OF PRESENT ILLNESS      Patient is a 66 year old female who presents today with sore throat, low back pain.  Patient Arabic speaking, presents with family member who provides interpretation, offered formal interpretation however they declined.  They report odynophagia, intermittent subjective fever since yesterday.  General body aches and chills noted as well.  No significant abdominal pain however does note low back pain, stated that this started after trying MiraLAX.  Has reported similar symptoms before, son notes that when admitted to the hospital last she had this in January, on CT scan looks like she had evidence of rectosigmoid colitis.  No described mass or lesion.  No urinary habit changes described.  No hematochezia or melena.  Notes that the sore throat has worsened over the last 24 hours.  Notes some pain to the left submandibular region radiating up to the left ear.  Limited p.o. intake in the setting.  No antibiotic use currently.  Has history of recurrent strep throat.        Nursing Notes were reviewed.    REVIEW OF SYSTEMS       Review of Systems   Constitutional:  Negative for chills and fever.   HENT:  Positive for sore throat.    Respiratory:  Negative for shortness of breath and wheezing.    Cardiovascular:  Negative for chest pain.   Gastrointestinal:  Negative for abdominal pain, nausea and vomiting.   Genitourinary:  Negative for dysuria, hematuria, vaginal bleeding and vaginal pain.   Neurological:  Negative for weakness and numbness.     Except as noted above the remainder of the review of  systems was reviewed and negative.       PAST MEDICAL HISTORY     Past Medical History:   Diagnosis Date    GERD (gastroesophageal reflux disease)     Hyperlipidemia     Hypertension     T2DM (type 2 diabetes mellitus) (HCC)          SURGICAL HISTORY       Past Surgical History:   Procedure Laterality Date    CARDIAC PROCEDURE N/A 09/24/2021    Left heart cath / coronary angiography performed by Cyril Mourning, MD at [facility] CARDIAC CATH[facility]EP [facility]         CURRENT MEDICATIONS       Previous Medications    ACETAMINOPHEN CHILDRENS 160 MG/5ML SOLN    Take 650 mg by mouth every 4-6 hours as needed (fever)    AMLODIPINE (NORVASC) 5 MG TABLET    Take 5 mg by mouth daily    ASPIRIN 325 MG TABLET    Take 100 mg by mouth daily    DICLOFENAC SODIUM (VOLTAREN) 1 % GEL    Apply 2 g topically 4 times daily for 15 days As needed for back pain    ESOMEPRAZOLE MAGNESIUM (NEXIUM) 20 MG PACK    Take 1 packet by mouth 2 times daily    IBUPROFEN (IBU) 400 MG TABLET  Take 1 tablet by mouth every 8 hours as needed for Pain    SIMVASTATIN (ZOCOR) 20 MG TABLET    Take 20 mg by mouth nightly    SUCRALFATE (CARAFATE) 1 GM TABLET    Take 1 tablet by mouth every 8 hours    VALSARTAN (DIOVAN) 80 MG TABLET    Take 100 mg by mouth daily       ALLERGIES     Patient has no known allergies.    FAMILY HISTORY     No family history on file.       SOCIAL HISTORY       Social History     Socioeconomic History    Marital status: Married   Tobacco Use    Smoking status: Never    Smokeless tobacco: Never   Substance and Sexual Activity    Alcohol use: Never    Drug use: Never       SCREENINGS         Glasgow Coma Scale  Eye Opening: Spontaneous  Best Verbal Response: Oriented  Best Motor Response: Obeys commands  Glasgow Coma Scale Score: 15                     CIWA Assessment  BP: 137/75  Heart Rate: 78                 PHYSICAL EXAM       ED Triage Vitals [12/27/21 0106]   BP Temp Temp src Heart Rate Resp SpO2 Height Weight   137/75 99.3 F (37.4 C) -- 78  16 98 % 5\' 3"  (1.6 m) 117 lb (53.1 kg)       Physical Exam  Vitals and nursing note reviewed.   Constitutional:       General: She is not in acute distress.     Appearance: She is not ill-appearing.   HENT:      Head: Normocephalic and atraumatic.      Right Ear: External ear normal.      Left Ear: External ear normal.      Nose: Nose normal.      Mouth/Throat:      Mouth: Mucous membranes are moist.      Pharynx: Oropharynx is clear.      Comments: No posterior oropharyngeal erythema or edema, no exudates, no uvular deviation  Eyes:      Extraocular Movements: Extraocular movements intact.      Conjunctiva/sclera: Conjunctivae normal.      Pupils: Pupils are equal, round, and reactive to light.   Neck:      Comments: No submandibular swelling, very mild submandibular pain near the angle of the mandible, no appreciable adenopathy, trachea midline  Cardiovascular:      Rate and Rhythm: Normal rate and regular rhythm.      Pulses: Normal pulses.      Heart sounds: No murmur heard.    No friction rub. No gallop.   Pulmonary:      Effort: Pulmonary effort is normal. No respiratory distress.      Breath sounds: No wheezing, rhonchi or rales.   Abdominal:      General: Abdomen is flat. Bowel sounds are normal. There is no distension.      Palpations: Abdomen is soft. There is no mass.      Tenderness: There is no abdominal tenderness. There is no guarding.      Comments: No appreciable abdominal tenderness to palpation  Musculoskeletal:         General: No deformity or signs of injury.   Skin:     General: Skin is warm.      Capillary Refill: Capillary refill takes less than 2 seconds.      Findings: No rash.   Neurological:      General: No focal deficit present.      Mental Status: She is oriented to person, place, and time.      Cranial Nerves: No cranial nerve deficit.   Psychiatric:         Mood and Affect: Mood normal.         Behavior: Behavior normal.       DIAGNOSTIC RESULTS     RADIOLOGY:     Interpretation per  the Radiologist below, if available at the time of this note:    CT ABDOMEN PELVIS W IV CONTRAST Additional Contrast? None    (Results Pending)       LABS:  Labs Reviewed   CBC WITH AUTO DIFFERENTIAL - Abnormal; Notable for the following components:       Result Value    WBC 11.9 (*)     Neutrophils Absolute 8.5 (*)     Absolute Mono # 1.3 (*)     All other components within normal limits   BASIC METABOLIC PANEL - Abnormal; Notable for the following components:    Glucose 103 (*)     All other components within normal limits   URINALYSIS W/ RFLX MICROSCOPIC - Abnormal; Notable for the following components:    Appearance Slightly Cloudy (*)     Blood, Urine Large (*)     Protein, UA 30 (*)     Leukocyte Esterase, Urine Trace (*)     All other components within normal limits   MICROSCOPIC URINALYSIS - Abnormal; Notable for the following components:    WBC, UA 3-5 (*)     RBC, UA 11-20 (*)     Squam Epithel, UA Few (*)     All other components within normal limits    Narrative:     Added on by Discern Rule   RAPID STREP SCREEN   COVID-19 & INFLUENZA COMBO [facility])    Narrative:     Is this test for diagnosis or screening?->Diagnosis of ill patient  Symptomatic for COVID-19 as defined by CDC?->Yes  Date of Symptom Onset->12/27/21  Hospitalized for COVID-19?->No  Admitted to ICU for COVID-19?->No  Pregnant?->No       All other labs were within normal range or not returned as of this dictation.    EMERGENCY DEPARTMENT COURSE and DIFFERENTIAL DIAGNOSIS/MDM:   Vitals:    Vitals:    12/27/21 0106   BP: 137/75   Pulse: 78   Resp: 16   Temp: 99.3 F (37.4 C)   SpO2: 98%   Weight: 53.1 kg   Height: 5\' 3"  (1.6 m)         Medical Decision Making  Patient is a 66 year old female who presents today with sore throat, low back pain, rectal pain.  Patient with stable vital signs on presentation today.  Physical exam as document above.  No evidence of deep space infection on ENT exam, concern for potential strep pharyngitis  however.  Treated with Decadron for symptom control, Toradol.  Patient additionally describes low back pain however sounds more like potential rectal area pain, States Similarity to episode of rectosigmoid colitis in January.  Started after she took MiraLAX, could be  rectal spasm.  With potential for colitis in this area, deferred internal rectal exam to avoid any sort of risk of perforation.  Instead will obtain CT abdomen pelvis for more sensitive evaluation of this area.  Patient and family are amenable with this plan.  We will continue to reevaluate closely.    Amount and/or Complexity of Data Reviewed  Labs: ordered.  Radiology: ordered.    Risk  Prescription drug management.          REASSESSMENT     ED Course as of 12/27/21 0504   Sun Dec 27, 2021   0458 Laboratory work-up shows mild leukocytosis 11.9 however no significant left shift.  Metabolic panel without any significant derangement.  Urine shows potential evidence of infection, blood, cloudy appearing, trace leuk esterase with both blood and WBCs.  Will treat as an outpatient with course of Augmentin, covering both for suspected pharyngitis as well.  Patient notes that she is only had good success with IM injection of Bicillin, will oblige and treat with dose in the emergency department as well.  CT on pelvis without any acute abnormality, recommended GI follow-up strict return precautions provided, otherwise patient appropriate for discharge home today.  Discharged in stable condition. [EV]      ED Course User Index  [EV] Ramond Marrow, MD         PROCEDURES     Unless otherwise noted below, none     Procedures    FINAL IMPRESSION      1. Acute pharyngitis, unspecified etiology    2. Acute cystitis without hematuria          DISPOSITION/PLAN   DISPOSITION Decision To Discharge 12/27/2021 05:03:00 AM      PATIENT REFERRED TO:  Primary Care Provider  -Please follow up with primary care provider in timeframe as indicated here, you can call  [phone] to help establish with a provider in the [facility] [place] system.  Schedule an appointment as soon as possible for a visit in 3 days  Reevaluation    [facility]  [address] [place] [address]  [phone]  Schedule an appointment as soon as possible for a visit   Reevaluation, As needed, If symptoms worsen      DISCHARGE MEDICATIONS:  New Prescriptions    AMOXICILLIN-CLAVULANATE (AUGMENTIN) 875-125 MG PER TABLET    Take 1 tablet by mouth 2 times daily for 10 days     Controlled Substances Monitoring:     No flowsheet data found.    (Please note that portions of this note were completed with a voice recognition program.  Efforts were made to edit the dictations but occasionally words are mis-transcribed.)    Ramond Marrow, MD (electronically signed)  Attending Emergency Physician           Ramond Marrow, MD  12/27/21 [phone]

## 2022-04-20 NOTE — Telephone Encounter (Signed)
Males answered the phone and said patient wasn't there.

## 2024-07-16 NOTE — Telephone Encounter (Signed)
"  The patient's son submitted an online request for a new patient appointment.I contacted the son to assist with scheduling. He stated that she has been recently having vaginal discharge and vaginal pain. He stated that they have tried urgent are, but she needs to see a GYN. He stated that she needs to be seen as soon as possible if she is able to get in. Please contact to assist.   "

## 2024-07-17 NOTE — Telephone Encounter (Signed)
"  Spoke with pt son Youssef> appt sched on 11/26 w/Denise> gave address  "

## 2024-07-25 ENCOUNTER — Encounter

## 2024-07-25 ENCOUNTER — Ambulatory Visit: Admit: 2024-07-25 | Discharge: 2024-07-25

## 2024-07-25 VITALS — BP 163/73 | Ht 62.0 in | Wt 120.0 lb

## 2024-07-25 DIAGNOSIS — Z1231 Encounter for screening mammogram for malignant neoplasm of breast: Principal | ICD-10-CM

## 2024-07-25 LAB — CULTURE, URINE: FINAL REPORT: NO GROWTH

## 2024-07-25 NOTE — Progress Notes (Signed)
 "  Isabella Ortiz is a pleasant 68 y.o. female, here for evaluation of the following chief complaint(s):  Chief Complaint   Patient presents with    New Patient     C/o vaginal discharge and pain for 1 week          Subjective   SUBJECTIVE/OBJECTIVE:  HPI  Isabella Ortiz is a pleasant 68 y.o. here for concerns of vaginal discharge.    Patient here today with daughter, interpreter services used during visit.    Patient has concerns of 1 week of a thin yellow vaginal discharge and right-sided flank pain.  Patient denies vaginal bleeding, vaginal irritation, malodor, dysuria, urinary frequency.  Patient is sexually active.  Denies dyspareunia and postcoital bleeding.  Patient has not had Pap smear in 20 years.  Reports no history of abnormal Pap.    Review of Systems   Constitutional:  Negative for chills, fatigue and fever.   Respiratory:  Negative for chest tightness and shortness of breath.    Cardiovascular:  Negative for chest pain.   Gastrointestinal:  Positive for abdominal pain. Negative for constipation, diarrhea and nausea.   Genitourinary:  Positive for vaginal discharge. Negative for dyspareunia, dysuria, frequency, pelvic pain, urgency, vaginal bleeding and vaginal pain.   Neurological:  Negative for light-headedness and headaches.   Psychiatric/Behavioral:  Negative for behavioral problems.             Objective   Physical Exam  Constitutional:       General: She is not in acute distress.     Appearance: Normal appearance. She is not ill-appearing, toxic-appearing or diaphoretic.   Genitourinary:      Right Labia: No rash or lesions.     Left Labia: No lesions or rash.     No vaginal discharge, erythema, bleeding or ulceration.      Anterior vaginal prolapse present.     Moderate vaginal atrophy present.       Right Adnexa: not tender and no mass present.     Left Adnexa: not tender and no mass present.     No cervical motion tenderness, discharge, friability or lesion.      Uterus is not enlarged or tender.       Bladder is not tender.    Breasts:     Right: No mass, skin change or tenderness.      Left: No mass, skin change or tenderness.   HENT:      Head: Normocephalic.   Cardiovascular:      Rate and Rhythm: Normal rate.      Pulses: Normal pulses.   Pulmonary:      Effort: Pulmonary effort is normal.   Abdominal:      General: Abdomen is flat. There is no distension.      Palpations: Abdomen is soft.      Tenderness: There is no abdominal tenderness. There is no right CVA tenderness or left CVA tenderness.   Lymphadenopathy:      Upper Body:      Right upper body: No axillary adenopathy.      Left upper body: No axillary adenopathy.   Neurological:      General: No focal deficit present.      Mental Status: She is alert and oriented to person, place, and time.   Skin:     General: Skin is warm and dry.   Psychiatric:         Mood and Affect: Mood normal.  Behavior: Behavior normal.   Vitals reviewed.             Vitals:    07/25/24 0941   BP: (!) 163/73   Weight: 54.4 kg (120 lb)   Height: 1.575 m (5' 2)            ASSESSMENT/PLAN:   Diagnosis Orders   1. Encounter for screening mammogram for malignant neoplasm of breast  MAM TOMO DIGITAL SCREEN BILATERAL (PER PROTOCOL)      2. Dysuria  Culture, Urine      3. Possible exposure to STD  Nuswab Vaginitis Plus (VG+) ([facility] DEFAULT)      4. Screening for HPV (human papillomavirus)  PAP IG, Aptima HPV and rfx 16/18,45 (800694)      5. Cervical cancer screening  PAP IG, Aptima HPV and rfx 16/18,45 (800694)      6. Vaginal discharge  Nuswab Vaginitis Plus (VG+) ([facility] DEFAULT)              Plan      Vaginal discharge-NuSwab collected today.  Will contact patient with results.  Discussed postmenopausal vaginal atrophy that may be contributing to patient's symptoms.  Can try vaginal estrogen cream if infection is ruled out.    Cervical cancer screening due-Pap smear collected today    Breast cancer screening due-mammogram ordered today      Return in about 1 year  (around 07/25/2025) for annual exam.       "

## 2024-07-25 NOTE — Consults (Signed)
 Session ID: [id number]  Session Duration: Longer than None minutes  Language: Arabic  Interpreter ID: #[id number]  Interpreter Name: Nena

## 2024-07-29 LAB — NUSWAB VAGINITIS PLUS (VG+)
Candida albicans, NAA: NEGATIVE
Candida glabrata: NEGATIVE
Chlamydia trachomatis, NAA: NEGATIVE
Neisseria Gonorrhoeae, NAA: NEGATIVE
Trichomonas Vaginalis by NAA: NEGATIVE

## 2024-07-30 ENCOUNTER — Encounter

## 2024-07-30 LAB — PAP IG, APTIMA HPV AND RFX 16/18,45 (199305)
.: 0
HPV Aptima: NEGATIVE

## 2024-07-31 MED ORDER — ESTRADIOL 0.01 % VA CREA
0.01 | Freq: Every evening | VAGINAL | 0 refills | 84.00000 days | Status: AC
Start: 2024-07-31 — End: ?

## 2024-07-31 MED ORDER — ESTRADIOL 0.01 % VA CREA
0.01 | Freq: Every evening | VAGINAL | 0 refills | 84.00000 days | Status: DC
Start: 2024-07-31 — End: 2024-07-31
